# Patient Record
Sex: Female | Born: 1950 | Race: Black or African American | Hispanic: No | Smoking: Former smoker
Health system: Southern US, Community
[De-identification: ages and names within clinical notes are randomized; demographics above are authoritative.]

## PROBLEM LIST (undated history)

## (undated) DIAGNOSIS — E7800 Pure hypercholesterolemia, unspecified: Secondary | ICD-10-CM

---

## 1994-05-13 HISTORY — PX: BREAST EXCISIONAL BIOPSY: SUR124

## 1998-04-21 ENCOUNTER — Other Ambulatory Visit: Admission: RE | Admit: 1998-04-21 | Discharge: 1998-04-21 | Payer: Self-pay | Admitting: General Surgery

## 2003-07-26 ENCOUNTER — Other Ambulatory Visit: Admission: RE | Admit: 2003-07-26 | Discharge: 2003-07-26 | Payer: Self-pay | Admitting: Obstetrics and Gynecology

## 2004-07-11 ENCOUNTER — Encounter (INDEPENDENT_AMBULATORY_CARE_PROVIDER_SITE_OTHER): Payer: Self-pay | Admitting: *Deleted

## 2004-07-11 LAB — CONVERTED CEMR LAB

## 2004-09-26 ENCOUNTER — Ambulatory Visit: Payer: Self-pay | Admitting: Cardiology

## 2004-09-28 ENCOUNTER — Ambulatory Visit: Payer: Self-pay

## 2004-11-01 ENCOUNTER — Ambulatory Visit: Payer: Self-pay | Admitting: Family Medicine

## 2004-11-06 ENCOUNTER — Ambulatory Visit: Payer: Self-pay | Admitting: Sports Medicine

## 2004-11-22 ENCOUNTER — Ambulatory Visit: Payer: Self-pay | Admitting: Cardiology

## 2006-02-17 ENCOUNTER — Encounter: Admission: RE | Admit: 2006-02-17 | Discharge: 2006-02-17 | Payer: Self-pay | Admitting: Internal Medicine

## 2006-07-11 ENCOUNTER — Encounter (INDEPENDENT_AMBULATORY_CARE_PROVIDER_SITE_OTHER): Payer: Self-pay | Admitting: *Deleted

## 2008-11-08 ENCOUNTER — Emergency Department (HOSPITAL_COMMUNITY): Admission: EM | Admit: 2008-11-08 | Discharge: 2008-11-08 | Payer: Self-pay | Admitting: Family Medicine

## 2010-04-17 ENCOUNTER — Encounter: Admission: RE | Admit: 2010-04-17 | Discharge: 2010-04-17 | Payer: Self-pay | Admitting: Internal Medicine

## 2010-06-03 ENCOUNTER — Encounter: Payer: Self-pay | Admitting: Obstetrics and Gynecology

## 2010-08-20 LAB — GC/CHLAMYDIA PROBE AMP, GENITAL
Chlamydia, DNA Probe: NEGATIVE
GC Probe Amp, Genital: NEGATIVE

## 2010-08-20 LAB — WET PREP, GENITAL: Yeast Wet Prep HPF POC: NONE SEEN

## 2013-08-25 ENCOUNTER — Other Ambulatory Visit: Payer: Self-pay | Admitting: Internal Medicine

## 2013-08-25 DIAGNOSIS — Z1231 Encounter for screening mammogram for malignant neoplasm of breast: Secondary | ICD-10-CM

## 2013-08-26 ENCOUNTER — Other Ambulatory Visit: Payer: Self-pay | Admitting: Internal Medicine

## 2013-08-26 ENCOUNTER — Ambulatory Visit
Admission: RE | Admit: 2013-08-26 | Discharge: 2013-08-26 | Disposition: A | Discharge status: Home / Self Care | Payer: BC Managed Care – PPO | Source: Ambulatory Visit | Attending: Internal Medicine | Admitting: Internal Medicine

## 2013-08-26 DIAGNOSIS — M545 Low back pain, unspecified: Secondary | ICD-10-CM

## 2013-09-08 ENCOUNTER — Ambulatory Visit
Admission: RE | Admit: 2013-09-08 | Discharge: 2013-09-08 | Disposition: A | Discharge status: Home / Self Care | Payer: BC Managed Care – PPO | Source: Ambulatory Visit | Attending: Internal Medicine | Admitting: Internal Medicine

## 2013-09-08 ENCOUNTER — Encounter (INDEPENDENT_AMBULATORY_CARE_PROVIDER_SITE_OTHER): Payer: Self-pay

## 2013-09-08 DIAGNOSIS — Z1231 Encounter for screening mammogram for malignant neoplasm of breast: Secondary | ICD-10-CM

## 2015-12-18 ENCOUNTER — Ambulatory Visit
Admission: RE | Admit: 2015-12-18 | Discharge: 2015-12-18 | Disposition: A | Discharge status: Home / Self Care | Payer: Managed Care, Other (non HMO) | Source: Ambulatory Visit | Attending: Internal Medicine | Admitting: Internal Medicine

## 2015-12-18 ENCOUNTER — Other Ambulatory Visit: Payer: Self-pay | Admitting: Internal Medicine

## 2015-12-18 DIAGNOSIS — R42 Dizziness and giddiness: Secondary | ICD-10-CM

## 2015-12-21 ENCOUNTER — Other Ambulatory Visit: Payer: Self-pay | Admitting: Internal Medicine

## 2015-12-21 ENCOUNTER — Ambulatory Visit
Admission: RE | Admit: 2015-12-21 | Discharge: 2015-12-21 | Disposition: A | Discharge status: Home / Self Care | Payer: Managed Care, Other (non HMO) | Source: Ambulatory Visit | Attending: Internal Medicine | Admitting: Internal Medicine

## 2015-12-21 DIAGNOSIS — R930 Abnormal findings on diagnostic imaging of skull and head, not elsewhere classified: Secondary | ICD-10-CM

## 2015-12-21 MED ORDER — GADOBENATE DIMEGLUMINE 529 MG/ML IV SOLN
12.0000 mL | Freq: Once | INTRAVENOUS | Status: AC | PRN
  Administered 2015-12-21: 12 mL via INTRAVENOUS

## 2016-03-11 DIAGNOSIS — Z23 Encounter for immunization: Secondary | ICD-10-CM | POA: Diagnosis not present

## 2016-03-11 DIAGNOSIS — S46911A Strain of unspecified muscle, fascia and tendon at shoulder and upper arm level, right arm, initial encounter: Secondary | ICD-10-CM | POA: Diagnosis not present

## 2016-04-10 DIAGNOSIS — Z1159 Encounter for screening for other viral diseases: Secondary | ICD-10-CM | POA: Diagnosis not present

## 2016-04-10 DIAGNOSIS — Z23 Encounter for immunization: Secondary | ICD-10-CM | POA: Diagnosis not present

## 2016-04-10 DIAGNOSIS — M8588 Other specified disorders of bone density and structure, other site: Secondary | ICD-10-CM | POA: Diagnosis not present

## 2016-04-10 DIAGNOSIS — E782 Mixed hyperlipidemia: Secondary | ICD-10-CM | POA: Diagnosis not present

## 2016-04-10 DIAGNOSIS — Z1389 Encounter for screening for other disorder: Secondary | ICD-10-CM | POA: Diagnosis not present

## 2016-04-10 DIAGNOSIS — M6740 Ganglion, unspecified site: Secondary | ICD-10-CM | POA: Diagnosis not present

## 2016-04-10 DIAGNOSIS — Z0000 Encounter for general adult medical examination without abnormal findings: Secondary | ICD-10-CM | POA: Diagnosis not present

## 2016-04-10 DIAGNOSIS — M1990 Unspecified osteoarthritis, unspecified site: Secondary | ICD-10-CM | POA: Diagnosis not present

## 2016-07-26 DIAGNOSIS — M65341 Trigger finger, right ring finger: Secondary | ICD-10-CM | POA: Diagnosis not present

## 2017-04-09 ENCOUNTER — Ambulatory Visit
Admission: RE | Admit: 2017-04-09 | Discharge: 2017-04-09 | Disposition: A | Discharge status: Home / Self Care | Payer: Medicare Other | Source: Ambulatory Visit | Attending: Internal Medicine | Admitting: Internal Medicine

## 2017-04-09 ENCOUNTER — Other Ambulatory Visit: Payer: Self-pay | Admitting: Internal Medicine

## 2017-04-09 DIAGNOSIS — Z1231 Encounter for screening mammogram for malignant neoplasm of breast: Secondary | ICD-10-CM | POA: Diagnosis not present

## 2017-04-09 DIAGNOSIS — Z0000 Encounter for general adult medical examination without abnormal findings: Secondary | ICD-10-CM | POA: Diagnosis not present

## 2017-04-09 DIAGNOSIS — E782 Mixed hyperlipidemia: Secondary | ICD-10-CM | POA: Diagnosis not present

## 2017-04-09 DIAGNOSIS — M8588 Other specified disorders of bone density and structure, other site: Secondary | ICD-10-CM | POA: Diagnosis not present

## 2017-04-09 DIAGNOSIS — M1990 Unspecified osteoarthritis, unspecified site: Secondary | ICD-10-CM | POA: Diagnosis not present

## 2017-09-29 ENCOUNTER — Other Ambulatory Visit: Payer: Self-pay | Admitting: Internal Medicine

## 2017-09-29 ENCOUNTER — Ambulatory Visit
Admission: RE | Admit: 2017-09-29 | Discharge: 2017-09-29 | Disposition: A | Discharge status: Home / Self Care | Payer: Medicare Other | Source: Ambulatory Visit | Attending: Internal Medicine | Admitting: Internal Medicine

## 2017-09-29 DIAGNOSIS — M25572 Pain in left ankle and joints of left foot: Secondary | ICD-10-CM

## 2017-09-29 DIAGNOSIS — S99922A Unspecified injury of left foot, initial encounter: Secondary | ICD-10-CM | POA: Diagnosis not present

## 2017-09-29 DIAGNOSIS — S99912A Unspecified injury of left ankle, initial encounter: Secondary | ICD-10-CM

## 2017-09-29 DIAGNOSIS — M79672 Pain in left foot: Secondary | ICD-10-CM | POA: Diagnosis not present

## 2017-09-29 DIAGNOSIS — S93492A Sprain of other ligament of left ankle, initial encounter: Secondary | ICD-10-CM | POA: Diagnosis not present

## 2017-09-29 DIAGNOSIS — S92102A Unspecified fracture of left talus, initial encounter for closed fracture: Secondary | ICD-10-CM | POA: Diagnosis not present

## 2017-09-29 DIAGNOSIS — M7989 Other specified soft tissue disorders: Secondary | ICD-10-CM | POA: Diagnosis not present

## 2017-10-15 DIAGNOSIS — S93492D Sprain of other ligament of left ankle, subsequent encounter: Secondary | ICD-10-CM | POA: Diagnosis not present

## 2017-11-06 DIAGNOSIS — S93492D Sprain of other ligament of left ankle, subsequent encounter: Secondary | ICD-10-CM | POA: Diagnosis not present

## 2018-11-17 DIAGNOSIS — H25813 Combined forms of age-related cataract, bilateral: Secondary | ICD-10-CM | POA: Diagnosis not present

## 2018-11-17 DIAGNOSIS — H40033 Anatomical narrow angle, bilateral: Secondary | ICD-10-CM | POA: Diagnosis not present

## 2018-12-09 ENCOUNTER — Emergency Department (HOSPITAL_BASED_OUTPATIENT_CLINIC_OR_DEPARTMENT_OTHER)
Admission: EM | Admit: 2018-12-09 | Discharge: 2018-12-09 | Disposition: A | Discharge status: Home / Self Care | Payer: Medicare Other | Attending: Emergency Medicine | Admitting: Emergency Medicine

## 2018-12-09 ENCOUNTER — Emergency Department (HOSPITAL_BASED_OUTPATIENT_CLINIC_OR_DEPARTMENT_OTHER): Payer: Medicare Other

## 2018-12-09 ENCOUNTER — Other Ambulatory Visit: Payer: Self-pay

## 2018-12-09 ENCOUNTER — Encounter (HOSPITAL_BASED_OUTPATIENT_CLINIC_OR_DEPARTMENT_OTHER): Payer: Self-pay | Admitting: Emergency Medicine

## 2018-12-09 DIAGNOSIS — Z87891 Personal history of nicotine dependence: Secondary | ICD-10-CM | POA: Insufficient documentation

## 2018-12-09 DIAGNOSIS — S92501A Displaced unspecified fracture of right lesser toe(s), initial encounter for closed fracture: Secondary | ICD-10-CM

## 2018-12-09 DIAGNOSIS — Z7982 Long term (current) use of aspirin: Secondary | ICD-10-CM | POA: Diagnosis not present

## 2018-12-09 DIAGNOSIS — S92511A Displaced fracture of proximal phalanx of right lesser toe(s), initial encounter for closed fracture: Secondary | ICD-10-CM | POA: Insufficient documentation

## 2018-12-09 DIAGNOSIS — Y939 Activity, unspecified: Secondary | ICD-10-CM | POA: Insufficient documentation

## 2018-12-09 DIAGNOSIS — Y999 Unspecified external cause status: Secondary | ICD-10-CM | POA: Diagnosis not present

## 2018-12-09 DIAGNOSIS — Y92009 Unspecified place in unspecified non-institutional (private) residence as the place of occurrence of the external cause: Secondary | ICD-10-CM | POA: Insufficient documentation

## 2018-12-09 DIAGNOSIS — S99921A Unspecified injury of right foot, initial encounter: Secondary | ICD-10-CM | POA: Diagnosis not present

## 2018-12-09 DIAGNOSIS — W2203XA Walked into furniture, initial encounter: Secondary | ICD-10-CM | POA: Diagnosis not present

## 2018-12-09 HISTORY — DX: Pure hypercholesterolemia, unspecified: E78.00

## 2018-12-09 NOTE — ED Triage Notes (Signed)
Stubbed right little toe on dresser this morning.  Pain and redness.  No deformity. Skin is intact.

## 2018-12-09 NOTE — ED Provider Notes (Signed)
[facility] EMERGENCY DEPARTMENT Provider Note   CSN: [id number] Arrival date & time: 12/09/18  0940     History   Chief Complaint Chief Complaint  Patient presents with  . Toe Injury    HPI [place] Gerbino is a 68 y.o. female.     Patient is a 68 year old female presenting with a right fifth toe injury.  Patient states her alarm clock went off this morning.  She stood up to turn it off and stubbed her toe on the dresser.  Pain is worse with ambulation and weightbearing.  She has tried icing it with little relief.  The history is provided by the patient.    Past Medical History:  Diagnosis Date  . High cholesterol     There are no active problems to display for this patient.   Past Surgical History:  Procedure Laterality Date  . BREAST EXCISIONAL BIOPSY Left 1996   benign     OB History   No obstetric history on file.      Home Medications    Prior to Admission medications   Medication Sig Start Date End Date Taking? Authorizing Provider  aspirin 81 MG tablet Take 81 mg by mouth daily.    [provider]  cholecalciferol (VITAMIN D) 1000 UNITS tablet Take 1,000 Units by mouth daily.    [provider]  Multiple Vitamin (MULTI VITAMIN DAILY) TABS Take by mouth.    [provider]  Omega-3 Fatty Acids (FISH OIL) 1200 MG CAPS Take by mouth.    [provider]  simvastatin (ZOCOR) 20 MG tablet Take 20 mg by mouth daily.    [provider]    Family History No family history on file.  Social History Social History   Tobacco Use  . Smoking status: Former Games developermoker  . Smokeless tobacco: Never Used  Substance Use Topics  . Alcohol use: Yes    Comment: occ  . Drug use: Never     Allergies   Statins and Sulfa antibiotics   Review of Systems Review of Systems  All other systems reviewed and are negative.    Physical Exam Updated Vital Signs BP (!) 142/88 (BP Location: Right Arm)   Pulse 63    Temp 98.1 F (36.7 C) (Oral)   Resp 16   Ht 5' 1.5" (1.562 m)   Wt 55.2 kg   SpO2 98%   BMI 22.62 kg/m   Physical Exam Vitals signs and nursing note reviewed.  Constitutional:      General: She is not in acute distress.    Appearance: Normal appearance. She is not ill-appearing.  HENT:     Head: Normocephalic.  Pulmonary:     Effort: Pulmonary effort is normal.  Musculoskeletal:     Comments: The right fifth toe is mildly swollen and erythematous.  There is no obvious deformity or rotation.  Neurological:     Mental Status: She is alert and oriented to person, place, and time.      ED Treatments / Results  Labs (all labs ordered are listed, but only abnormal results are displayed) Labs Reviewed - No data to display  EKG None  Radiology No results found.  Procedures Procedures (including critical care time)  Medications Ordered in ED Medications - No data to display   Initial Impression / Assessment and Plan / ED Course  I have reviewed the triage vital signs and the nursing notes.  Pertinent labs & imaging results that were available during my  care of the patient were reviewed by me and considered in my medical decision making (see chart for details).  X-rays show what suggests a minimally displaced fracture to the base of the fifth proximal phalanx.  This will be treated with buddy taping and follow-up as needed.  Patient concerned about being able to work and was provided an excuse for the next 3 days.  Final Clinical Impressions(s) / ED Diagnoses   Final diagnoses:  None    ED Discharge Orders    None       Veryl Speak, MD 12/09/18 1112

## 2018-12-09 NOTE — Discharge Instructions (Signed)
Buddy tape for stability and comfort.  Keep your foot elevated for the next 2 days as much as possible.  Ice for 20 minutes every 2 hours while awake for the next 2 days.  Ibuprofen 600 mg every 6 hours as needed for pain.

## 2019-03-01 DIAGNOSIS — H6123 Impacted cerumen, bilateral: Secondary | ICD-10-CM | POA: Diagnosis not present

## 2019-06-15 DIAGNOSIS — S92344A Nondisplaced fracture of fourth metatarsal bone, right foot, initial encounter for closed fracture: Secondary | ICD-10-CM | POA: Diagnosis not present

## 2019-06-15 DIAGNOSIS — S93491A Sprain of other ligament of right ankle, initial encounter: Secondary | ICD-10-CM | POA: Diagnosis not present

## 2019-06-18 IMAGING — DX DG FOOT COMPLETE 3+V*L*
3 series · 3 of 3 positions shown · non-contrast
Comparison: None.

CLINICAL DATA: Fall, foot/ankle pain/injury

EXAM:
LEFT FOOT - COMPLETE 3+ VIEW

[dg foot complete left (1 of 3)]
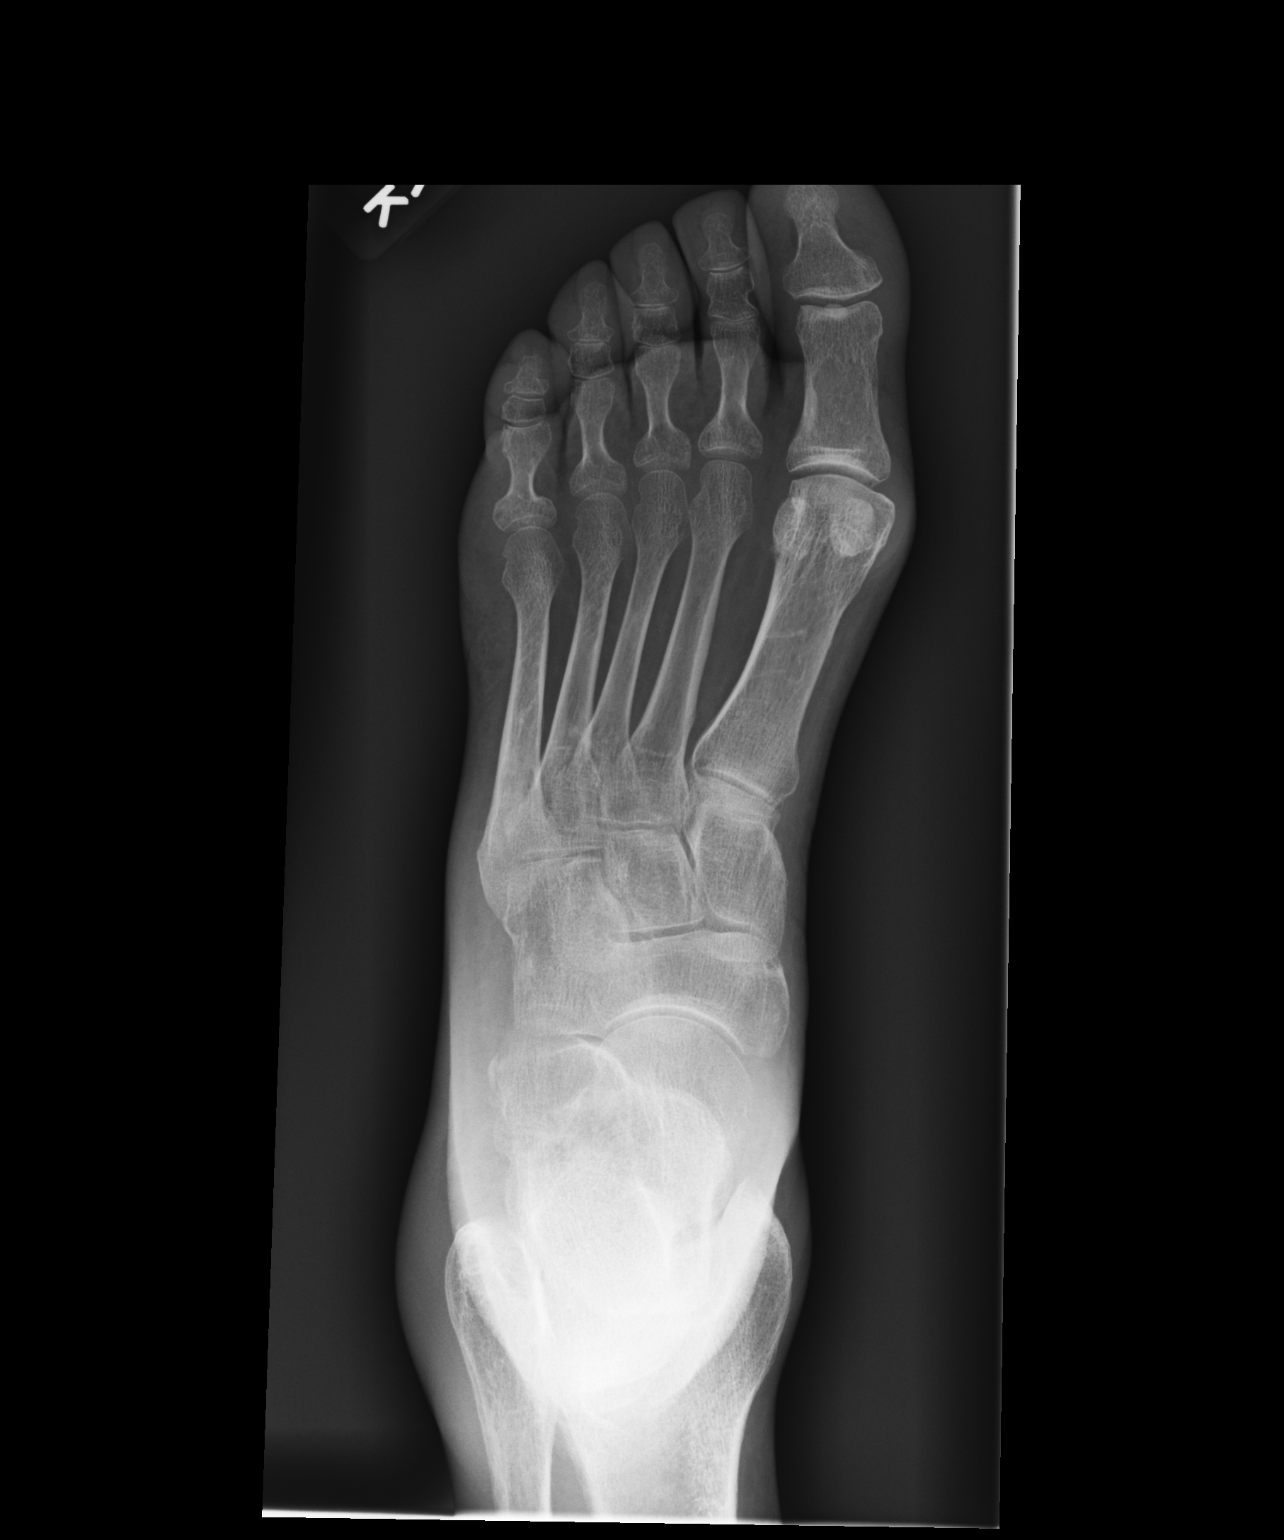

[dg foot complete left (2 of 3)]
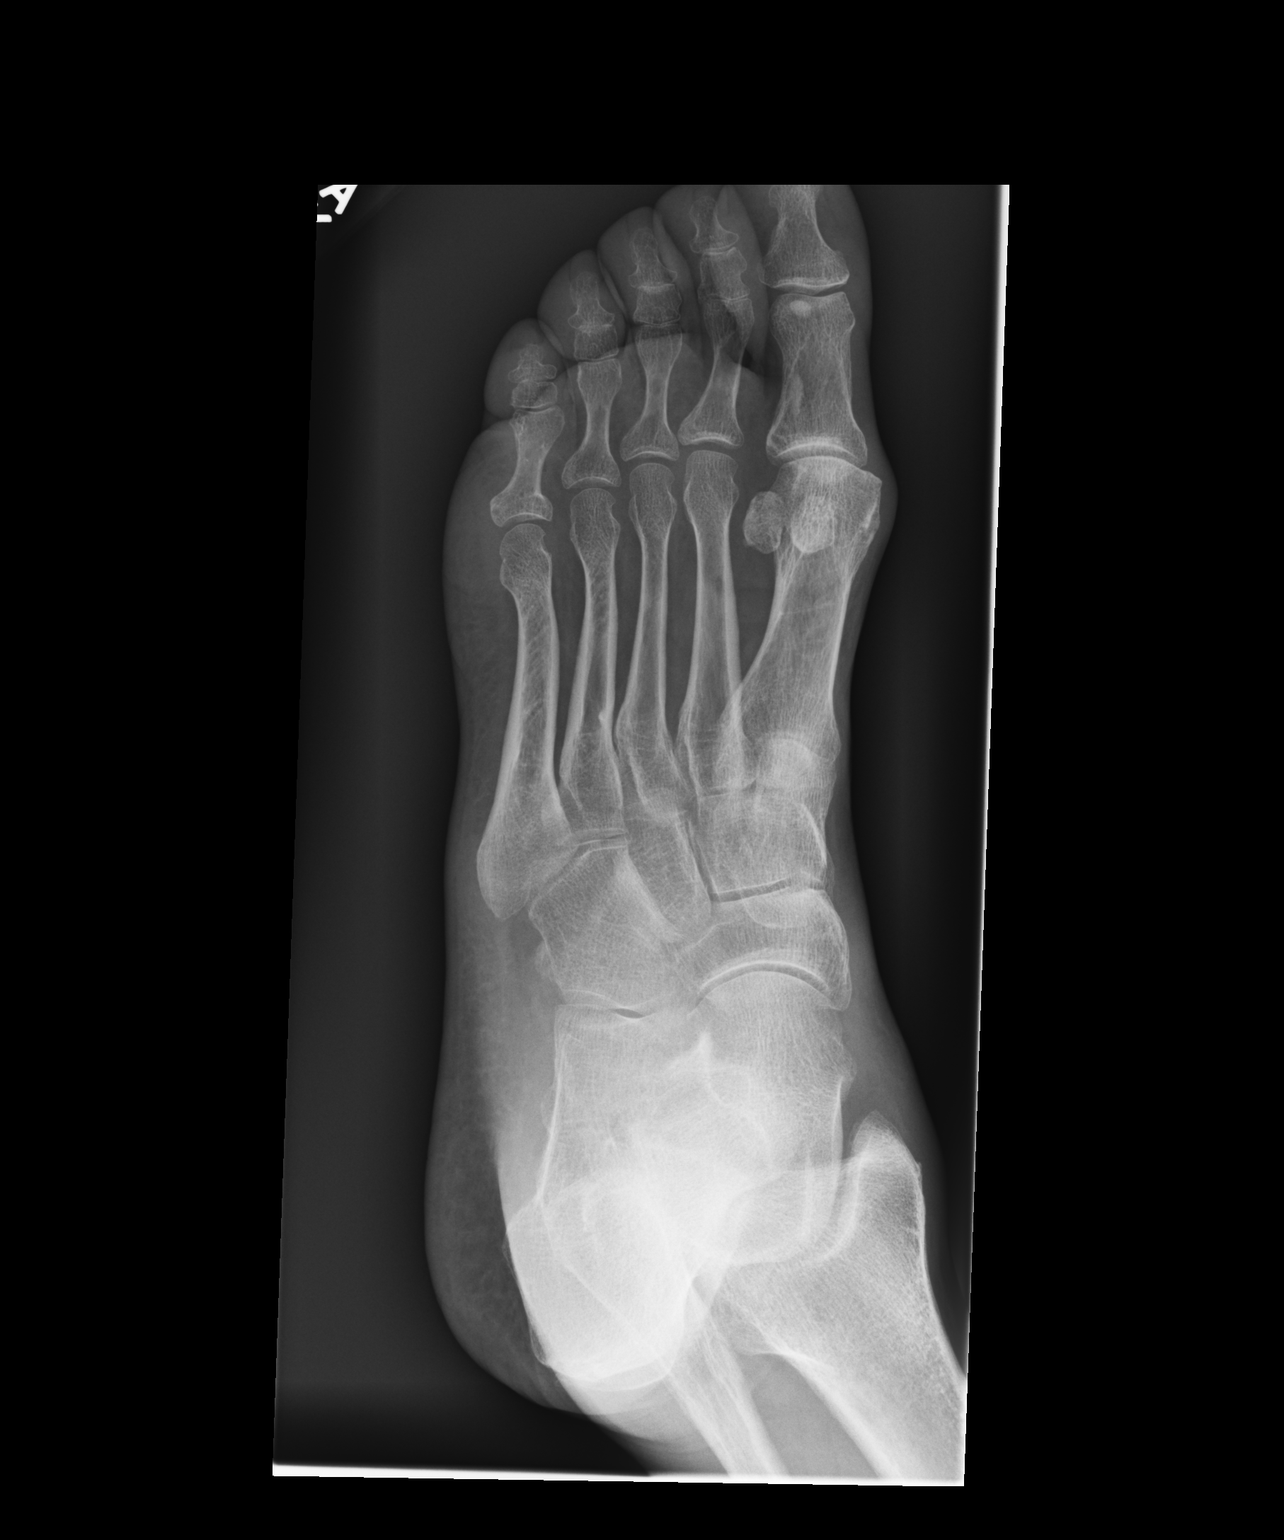

[dg foot complete left (3 of 3)]
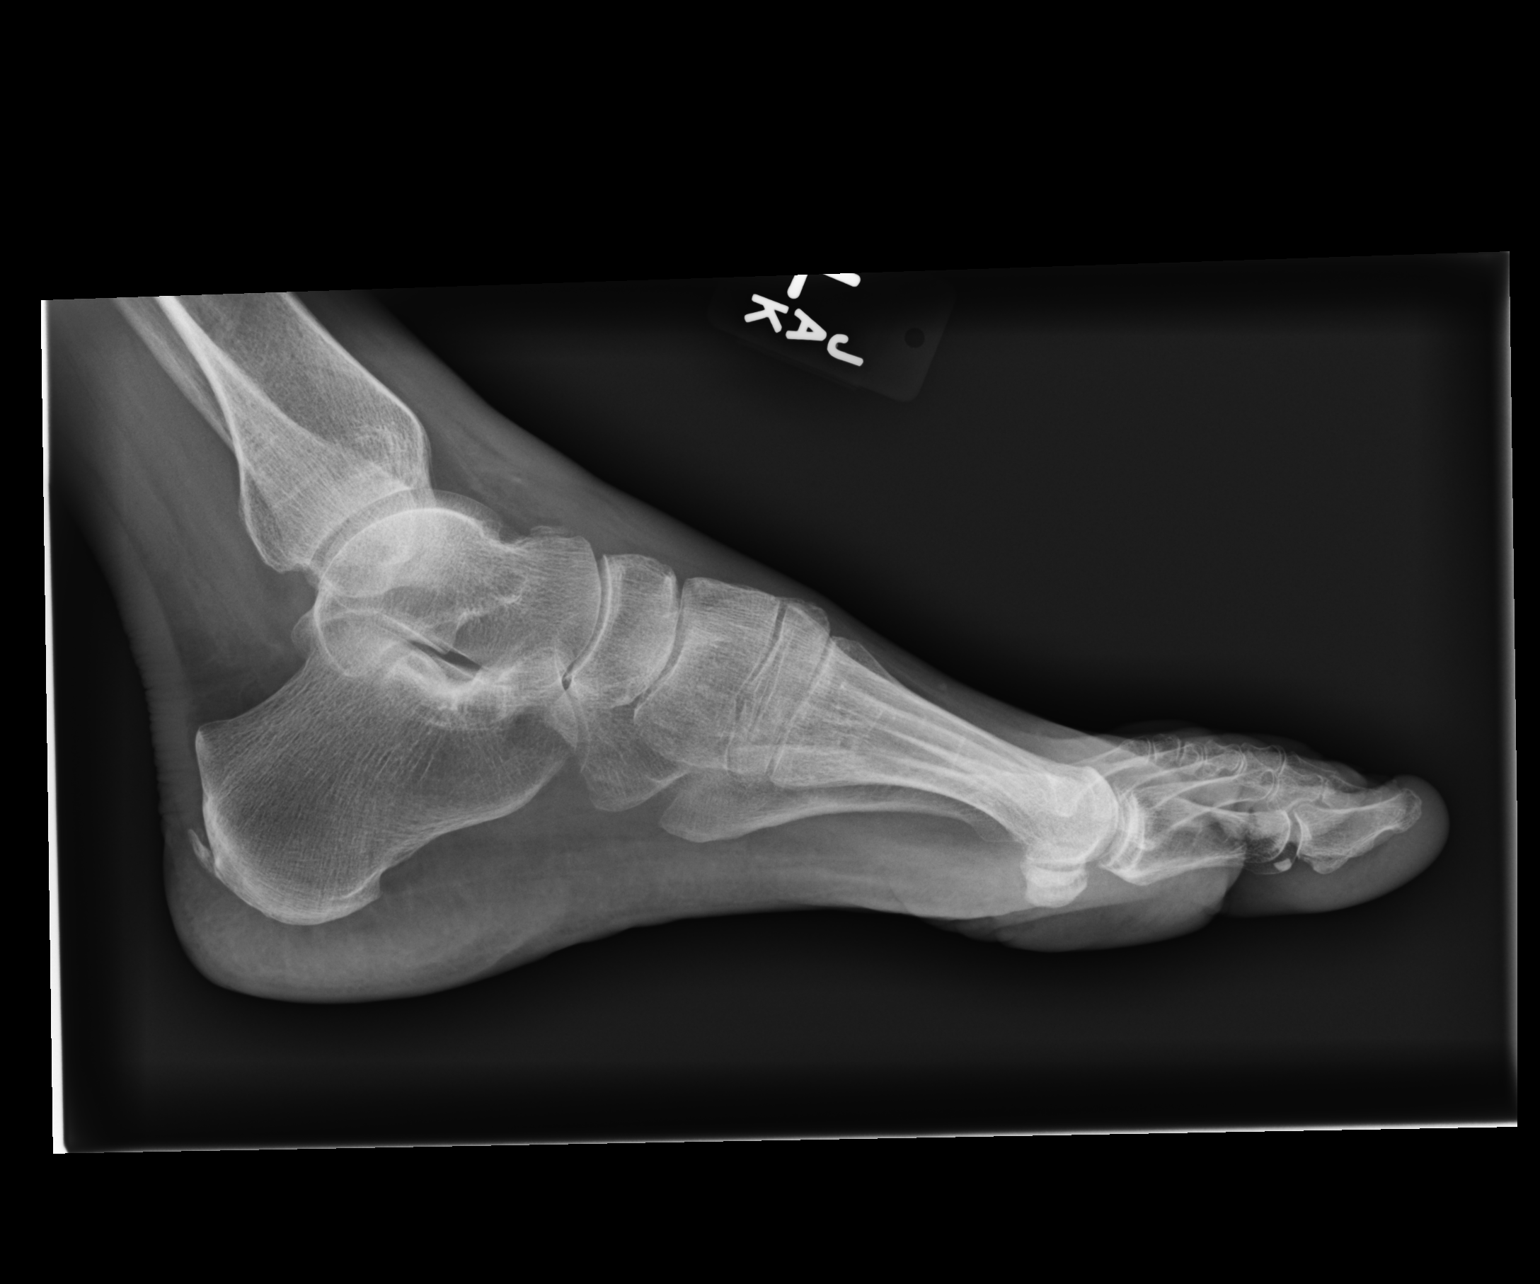

[3 of 3 positions shown; findings below may reference images not displayed]

FINDINGS: No fracture or dislocation is seen.

The joint spaces are preserved.

Mild dorsal soft tissue swelling.
IMPRESSION: Negative.

## 2019-06-29 DIAGNOSIS — S92344D Nondisplaced fracture of fourth metatarsal bone, right foot, subsequent encounter for fracture with routine healing: Secondary | ICD-10-CM | POA: Diagnosis not present

## 2019-06-29 DIAGNOSIS — R5383 Other fatigue: Secondary | ICD-10-CM | POA: Diagnosis not present

## 2019-06-29 DIAGNOSIS — E559 Vitamin D deficiency, unspecified: Secondary | ICD-10-CM | POA: Diagnosis not present

## 2019-06-29 DIAGNOSIS — M810 Age-related osteoporosis without current pathological fracture: Secondary | ICD-10-CM | POA: Diagnosis not present

## 2019-07-12 ENCOUNTER — Ambulatory Visit: Payer: Medicare Other | Attending: Internal Medicine

## 2019-07-12 DIAGNOSIS — Z23 Encounter for immunization: Secondary | ICD-10-CM | POA: Insufficient documentation

## 2019-07-12 NOTE — Progress Notes (Signed)
   Covid-19 Vaccination Clinic  Name:  Carol Hancock    MRN: [id number] DOB: 07/01/1950  07/12/2019  [name] was observed post Covid-19 immunization for 15 minutes without incidence. She was provided with Vaccine Information Sheet and instruction to access the V-Safe system.   [name] was instructed to call 911 with any severe reactions post vaccine: Marland Kitchen Difficulty breathing  . Swelling of your face and throat  . A fast heartbeat  . A bad rash all over your body  . Dizziness and weakness    Immunizations Administered    Name Date Dose VIS Date Route   Pfizer COVID-19 Vaccine 07/12/2019 10:40 AM 0.3 mL 04/23/2019 Intramuscular   Manufacturer: [employer], [employer]   Lot: [id number]   NDC: [address]-7

## 2019-07-13 DIAGNOSIS — S92344D Nondisplaced fracture of fourth metatarsal bone, right foot, subsequent encounter for fracture with routine healing: Secondary | ICD-10-CM | POA: Diagnosis not present

## 2019-08-10 ENCOUNTER — Ambulatory Visit: Payer: Medicare Other | Attending: Internal Medicine

## 2019-08-10 DIAGNOSIS — Z0000 Encounter for general adult medical examination without abnormal findings: Secondary | ICD-10-CM | POA: Diagnosis not present

## 2019-08-10 DIAGNOSIS — E782 Mixed hyperlipidemia: Secondary | ICD-10-CM | POA: Diagnosis not present

## 2019-08-10 DIAGNOSIS — M1990 Unspecified osteoarthritis, unspecified site: Secondary | ICD-10-CM | POA: Diagnosis not present

## 2019-08-10 DIAGNOSIS — Z23 Encounter for immunization: Secondary | ICD-10-CM

## 2019-08-10 DIAGNOSIS — Z1389 Encounter for screening for other disorder: Secondary | ICD-10-CM | POA: Diagnosis not present

## 2019-08-10 NOTE — Progress Notes (Signed)
   Covid-19 Vaccination Clinic  Name:  Carol Hancock    MRN: [id number] DOB: [birth year]  08/10/2019  Carol Hancock was observed post Covid-19 immunization for 30 minutes  without incident. She was provided with Vaccine Information Sheet and instruction to access the V-Safe system.   Carol Hancock was instructed to call 911 with any severe reactions post vaccine: Marland Kitchen Difficulty breathing  . Swelling of face and throat  . A fast heartbeat  . A bad rash all over body  . Dizziness and weakness   Immunizations Administered    Name Date Dose VIS Date Route   Pfizer COVID-19 Vaccine 08/10/2019 10:09 AM 0.3 mL 04/23/2019 Intramuscular   Manufacturer: [employer], [employer]   Lot: [id number]   NDC: [address]-4

## 2019-08-11 ENCOUNTER — Other Ambulatory Visit: Payer: Self-pay | Admitting: Internal Medicine

## 2019-08-11 DIAGNOSIS — M8580 Other specified disorders of bone density and structure, unspecified site: Secondary | ICD-10-CM

## 2019-08-11 DIAGNOSIS — Z1231 Encounter for screening mammogram for malignant neoplasm of breast: Secondary | ICD-10-CM

## 2019-10-19 ENCOUNTER — Ambulatory Visit
Admission: RE | Admit: 2019-10-19 | Discharge: 2019-10-19 | Disposition: A | Discharge status: Home / Self Care | Payer: Medicare Other | Source: Ambulatory Visit | Attending: Internal Medicine | Admitting: Internal Medicine

## 2019-10-19 ENCOUNTER — Other Ambulatory Visit: Payer: Self-pay

## 2019-10-19 DIAGNOSIS — M8580 Other specified disorders of bone density and structure, unspecified site: Secondary | ICD-10-CM

## 2019-10-19 DIAGNOSIS — Z1231 Encounter for screening mammogram for malignant neoplasm of breast: Secondary | ICD-10-CM

## 2019-10-19 DIAGNOSIS — Z780 Asymptomatic menopausal state: Secondary | ICD-10-CM | POA: Diagnosis not present

## 2019-10-19 DIAGNOSIS — M8589 Other specified disorders of bone density and structure, multiple sites: Secondary | ICD-10-CM | POA: Diagnosis not present

## 2019-11-19 DIAGNOSIS — M1711 Unilateral primary osteoarthritis, right knee: Secondary | ICD-10-CM | POA: Diagnosis not present

## 2019-11-24 DIAGNOSIS — H40033 Anatomical narrow angle, bilateral: Secondary | ICD-10-CM | POA: Diagnosis not present

## 2019-11-24 DIAGNOSIS — H16223 Keratoconjunctivitis sicca, not specified as Sjogren's, bilateral: Secondary | ICD-10-CM | POA: Diagnosis not present

## 2019-11-24 DIAGNOSIS — H25813 Combined forms of age-related cataract, bilateral: Secondary | ICD-10-CM | POA: Diagnosis not present

## 2019-12-09 DIAGNOSIS — M2041 Other hammer toe(s) (acquired), right foot: Secondary | ICD-10-CM | POA: Diagnosis not present

## 2019-12-09 DIAGNOSIS — L84 Corns and callosities: Secondary | ICD-10-CM | POA: Diagnosis not present

## 2019-12-09 DIAGNOSIS — M79671 Pain in right foot: Secondary | ICD-10-CM | POA: Diagnosis not present

## 2020-08-14 DIAGNOSIS — L659 Nonscarring hair loss, unspecified: Secondary | ICD-10-CM | POA: Diagnosis not present

## 2020-08-14 DIAGNOSIS — E782 Mixed hyperlipidemia: Secondary | ICD-10-CM | POA: Diagnosis not present

## 2020-08-14 DIAGNOSIS — R7303 Prediabetes: Secondary | ICD-10-CM | POA: Diagnosis not present

## 2020-08-14 DIAGNOSIS — M8580 Other specified disorders of bone density and structure, unspecified site: Secondary | ICD-10-CM | POA: Diagnosis not present

## 2020-08-28 ENCOUNTER — Other Ambulatory Visit: Payer: Self-pay | Admitting: Internal Medicine

## 2020-08-28 DIAGNOSIS — Z1231 Encounter for screening mammogram for malignant neoplasm of breast: Secondary | ICD-10-CM

## 2020-10-23 ENCOUNTER — Ambulatory Visit: Payer: Medicare Other

## 2020-11-27 DIAGNOSIS — H16223 Keratoconjunctivitis sicca, not specified as Sjogren's, bilateral: Secondary | ICD-10-CM | POA: Diagnosis not present

## 2020-11-27 DIAGNOSIS — H40033 Anatomical narrow angle, bilateral: Secondary | ICD-10-CM | POA: Diagnosis not present

## 2020-11-27 DIAGNOSIS — H25813 Combined forms of age-related cataract, bilateral: Secondary | ICD-10-CM | POA: Diagnosis not present

## 2020-12-15 ENCOUNTER — Ambulatory Visit
Admission: RE | Admit: 2020-12-15 | Discharge: 2020-12-15 | Disposition: A | Discharge status: Home / Self Care | Payer: Medicare Other | Source: Ambulatory Visit | Attending: Internal Medicine | Admitting: Internal Medicine

## 2020-12-15 ENCOUNTER — Other Ambulatory Visit: Payer: Self-pay

## 2020-12-15 DIAGNOSIS — Z1231 Encounter for screening mammogram for malignant neoplasm of breast: Secondary | ICD-10-CM | POA: Diagnosis not present

## 2021-02-16 DIAGNOSIS — H25812 Combined forms of age-related cataract, left eye: Secondary | ICD-10-CM | POA: Diagnosis not present

## 2021-04-18 DIAGNOSIS — Z01419 Encounter for gynecological examination (general) (routine) without abnormal findings: Secondary | ICD-10-CM | POA: Diagnosis not present

## 2021-04-18 DIAGNOSIS — R87615 Unsatisfactory cytologic smear of cervix: Secondary | ICD-10-CM | POA: Diagnosis not present

## 2021-05-17 DIAGNOSIS — L669 Cicatricial alopecia, unspecified: Secondary | ICD-10-CM | POA: Diagnosis not present

## 2021-05-17 DIAGNOSIS — L648 Other androgenic alopecia: Secondary | ICD-10-CM | POA: Diagnosis not present

## 2021-05-17 DIAGNOSIS — L668 Other cicatricial alopecia: Secondary | ICD-10-CM | POA: Diagnosis not present

## 2021-06-01 DIAGNOSIS — H25811 Combined forms of age-related cataract, right eye: Secondary | ICD-10-CM | POA: Diagnosis not present

## 2021-06-06 DIAGNOSIS — L668 Other cicatricial alopecia: Secondary | ICD-10-CM | POA: Diagnosis not present

## 2021-07-02 DIAGNOSIS — R87615 Unsatisfactory cytologic smear of cervix: Secondary | ICD-10-CM | POA: Diagnosis not present

## 2021-07-07 IMAGING — MG DIGITAL SCREENING BILAT W/ TOMO W/ CAD
8 series · 9 of 24 positions shown · non-contrast
Comparison: Previous exam(s).

CLINICAL DATA: Screening.

EXAM:
DIGITAL SCREENING BILATERAL MAMMOGRAM WITH TOMO AND CAD

[R MLO synth-2D]
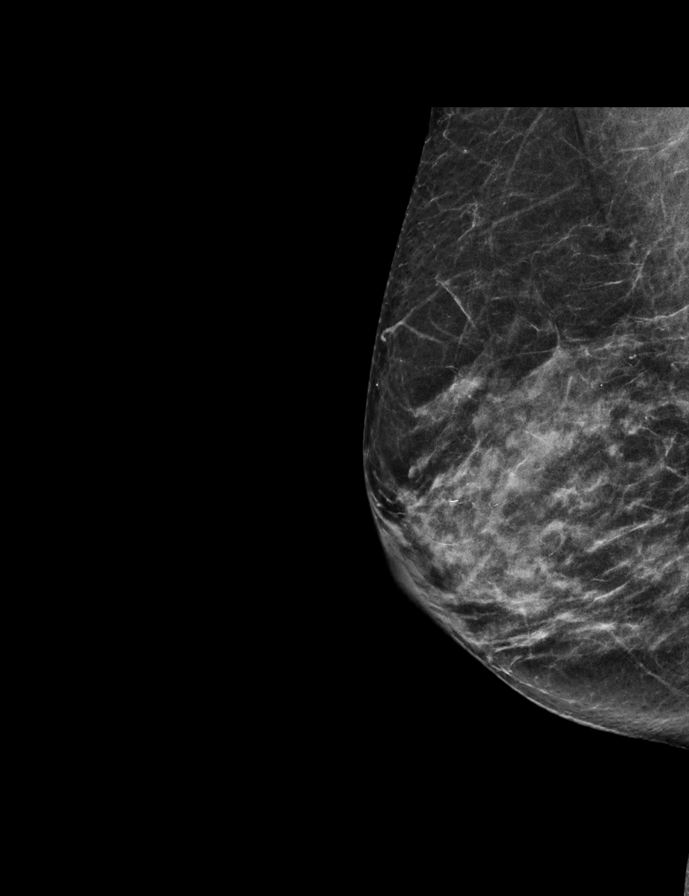

[L CC synth-2D]
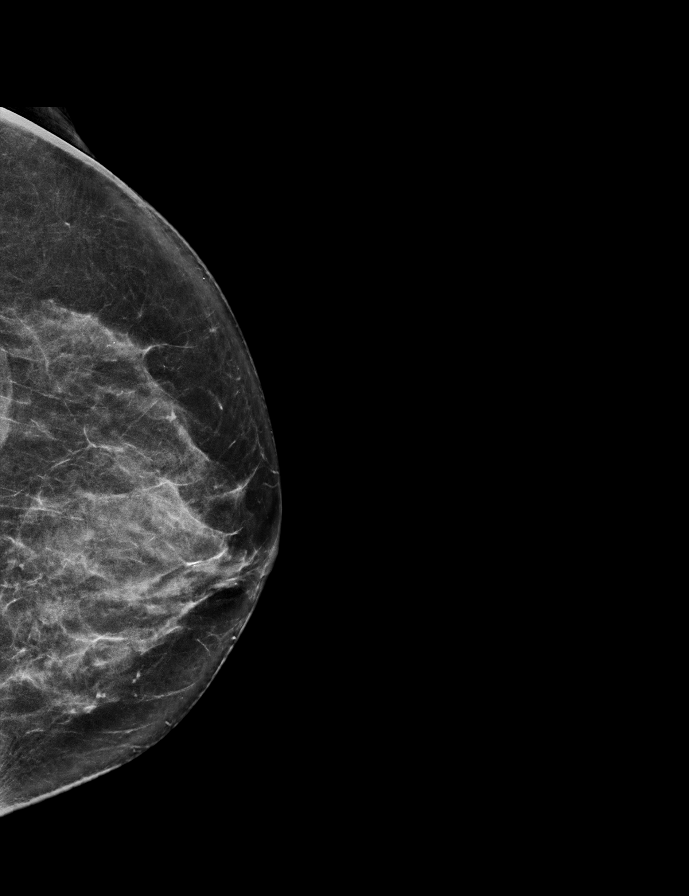

[R CC synth-2D]
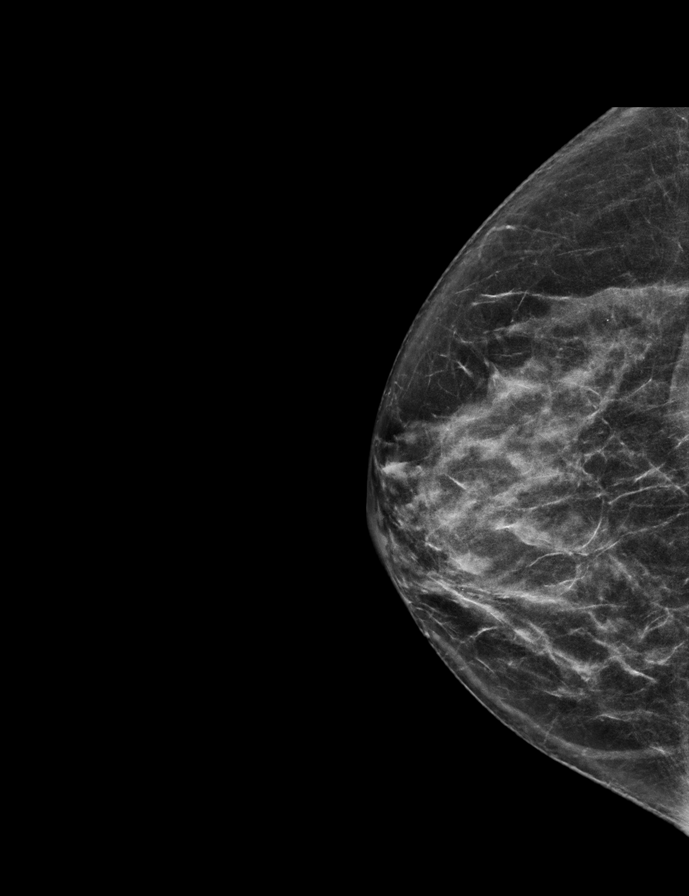

[L MLO synth-2D]
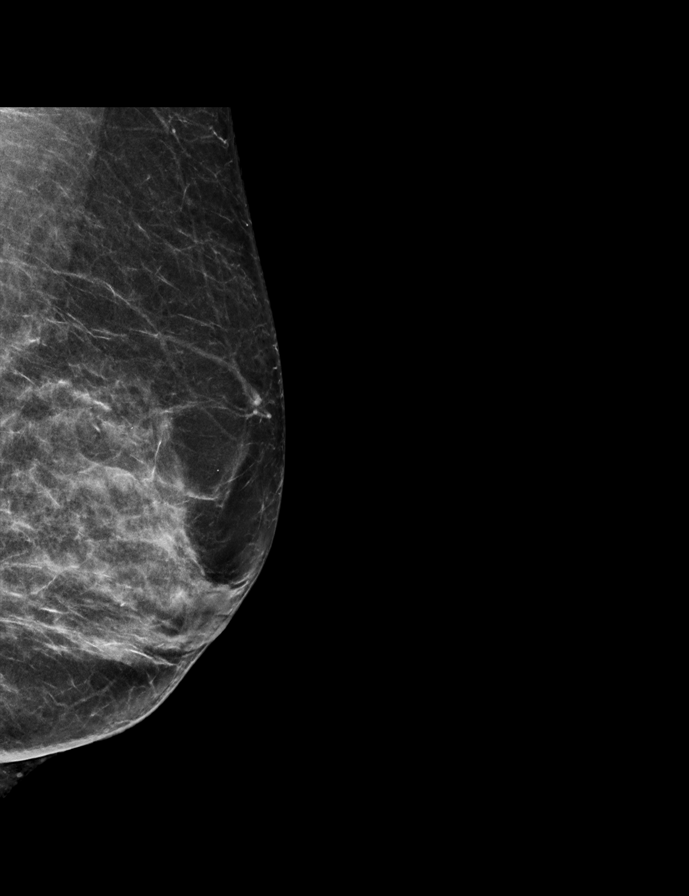

[L MLO tomo · 2 of 61 frames shown]
[frame 20/61]
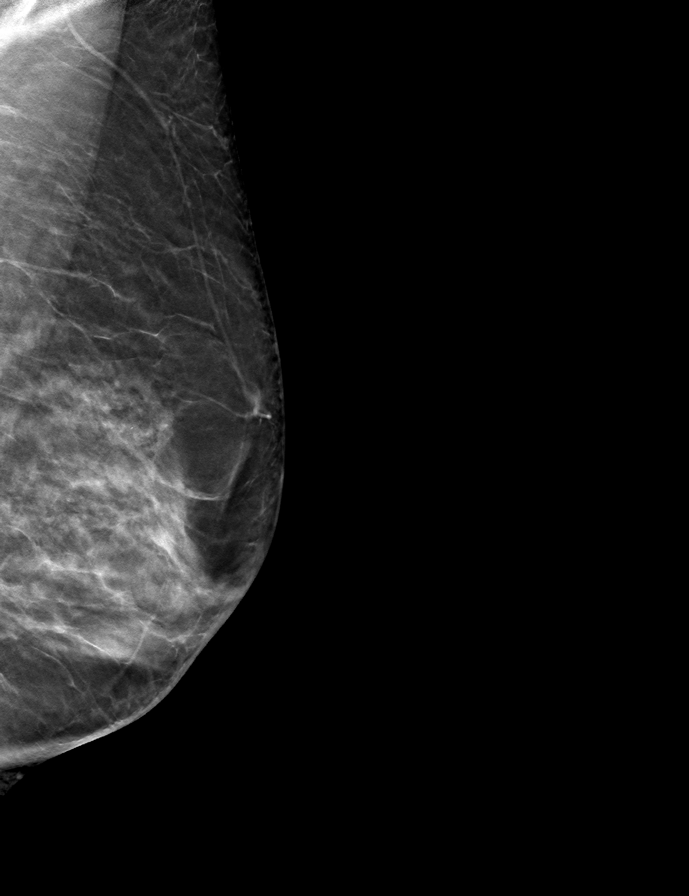
[frame 31/61]
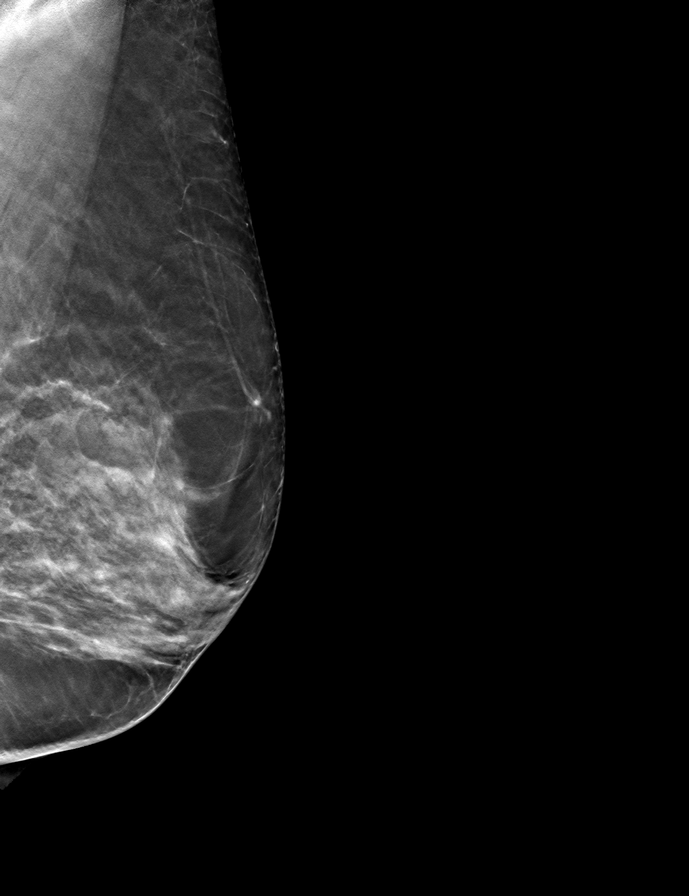

[R MLO tomo · tomo slice 28/55.0]
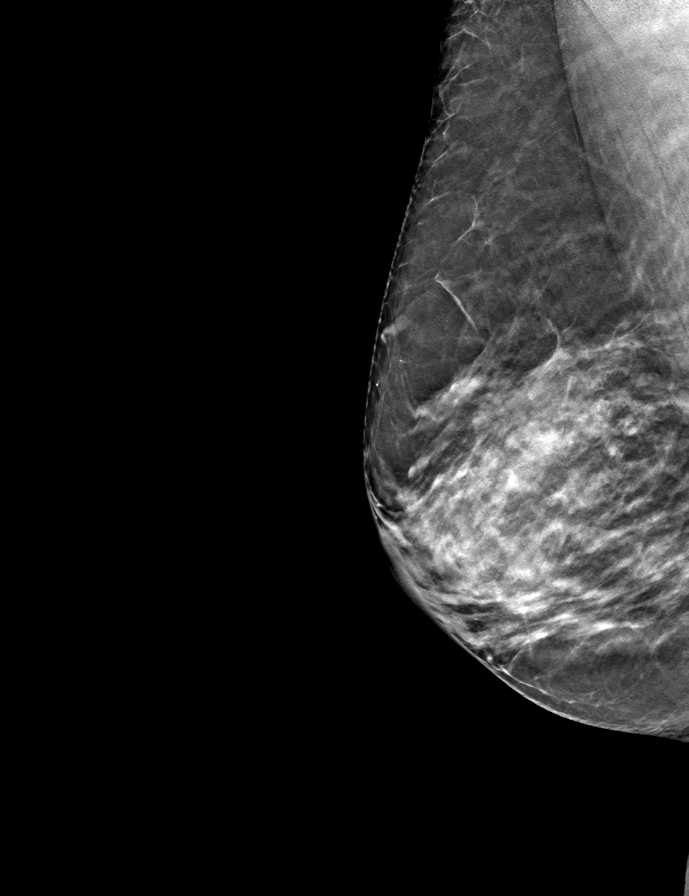

[R CC tomo · tomo slice 31/62.0]
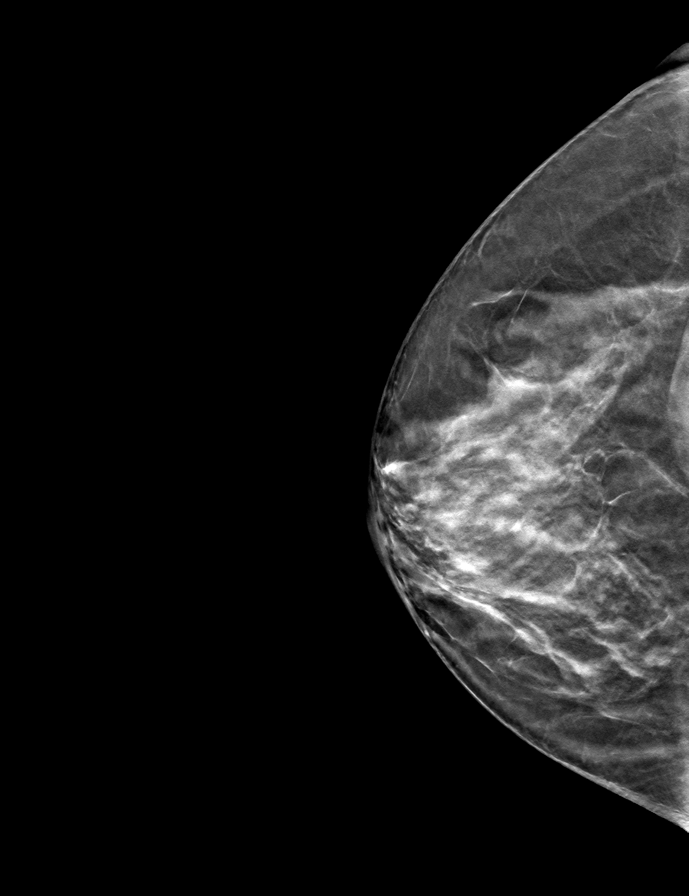

[L CC tomo · tomo slice 33/64.0]
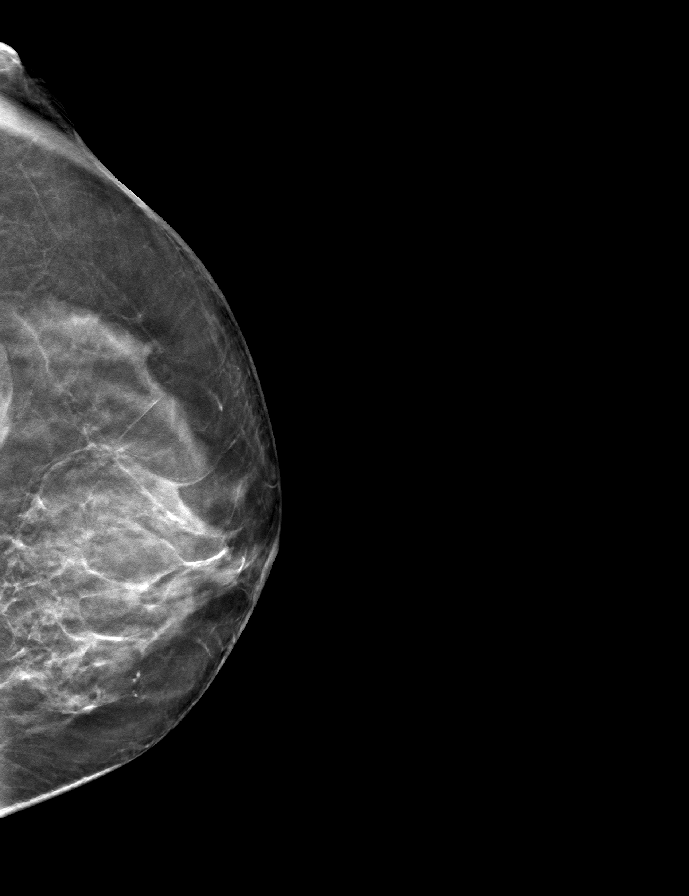

[9 of 24 positions shown; findings below may reference images not displayed]

ACR Breast Density Category c: The breast tissue is heterogeneously
dense, which may obscure small masses.
FINDINGS: There are no findings suspicious for malignancy. Images were
processed with CAD.
IMPRESSION: No mammographic evidence of malignancy. A result letter of this
screening mammogram will be mailed directly to the patient.

RECOMMENDATION:
Screening mammogram in one year. (Code:[id number])

BI-RADS CATEGORY  1: Negative.

## 2021-08-15 DIAGNOSIS — R7303 Prediabetes: Secondary | ICD-10-CM | POA: Diagnosis not present

## 2021-08-15 DIAGNOSIS — M1990 Unspecified osteoarthritis, unspecified site: Secondary | ICD-10-CM | POA: Diagnosis not present

## 2021-08-15 DIAGNOSIS — M8588 Other specified disorders of bone density and structure, other site: Secondary | ICD-10-CM | POA: Diagnosis not present

## 2021-08-15 DIAGNOSIS — E782 Mixed hyperlipidemia: Secondary | ICD-10-CM | POA: Diagnosis not present

## 2021-12-25 ENCOUNTER — Other Ambulatory Visit: Payer: Self-pay | Admitting: Internal Medicine

## 2021-12-25 DIAGNOSIS — R410 Disorientation, unspecified: Secondary | ICD-10-CM | POA: Diagnosis not present

## 2021-12-25 DIAGNOSIS — G454 Transient global amnesia: Secondary | ICD-10-CM | POA: Diagnosis not present

## 2021-12-26 ENCOUNTER — Ambulatory Visit
Admission: RE | Admit: 2021-12-26 | Discharge: 2021-12-26 | Disposition: A | Discharge status: Home / Self Care | Payer: Medicare Other | Source: Ambulatory Visit | Attending: Internal Medicine | Admitting: Internal Medicine

## 2021-12-26 DIAGNOSIS — R410 Disorientation, unspecified: Secondary | ICD-10-CM

## 2022-02-03 DIAGNOSIS — Z6823 Body mass index (BMI) 23.0-23.9, adult: Secondary | ICD-10-CM | POA: Diagnosis not present

## 2022-02-03 DIAGNOSIS — H1132 Conjunctival hemorrhage, left eye: Secondary | ICD-10-CM | POA: Diagnosis not present

## 2022-03-27 DIAGNOSIS — Z961 Presence of intraocular lens: Secondary | ICD-10-CM | POA: Diagnosis not present

## 2022-03-27 DIAGNOSIS — H40033 Anatomical narrow angle, bilateral: Secondary | ICD-10-CM | POA: Diagnosis not present

## 2022-04-12 DIAGNOSIS — H5213 Myopia, bilateral: Secondary | ICD-10-CM | POA: Diagnosis not present

## 2022-04-30 DIAGNOSIS — M7551 Bursitis of right shoulder: Secondary | ICD-10-CM | POA: Diagnosis not present

## 2022-04-30 DIAGNOSIS — M7581 Other shoulder lesions, right shoulder: Secondary | ICD-10-CM | POA: Diagnosis not present

## 2022-05-15 DIAGNOSIS — M79671 Pain in right foot: Secondary | ICD-10-CM | POA: Diagnosis not present

## 2022-05-15 DIAGNOSIS — L84 Corns and callosities: Secondary | ICD-10-CM | POA: Diagnosis not present

## 2022-05-15 DIAGNOSIS — M2041 Other hammer toe(s) (acquired), right foot: Secondary | ICD-10-CM | POA: Diagnosis not present

## 2022-05-29 DIAGNOSIS — M8580 Other specified disorders of bone density and structure, unspecified site: Secondary | ICD-10-CM | POA: Diagnosis not present

## 2022-05-29 DIAGNOSIS — Z01419 Encounter for gynecological examination (general) (routine) without abnormal findings: Secondary | ICD-10-CM | POA: Diagnosis not present

## 2022-05-29 DIAGNOSIS — E7800 Pure hypercholesterolemia, unspecified: Secondary | ICD-10-CM | POA: Diagnosis not present

## 2022-06-10 DIAGNOSIS — M109 Gout, unspecified: Secondary | ICD-10-CM | POA: Diagnosis not present

## 2022-06-10 DIAGNOSIS — M2041 Other hammer toe(s) (acquired), right foot: Secondary | ICD-10-CM | POA: Diagnosis not present

## 2022-06-25 DIAGNOSIS — K080 Exfoliation of teeth due to systemic causes: Secondary | ICD-10-CM | POA: Diagnosis not present

## 2022-07-04 DIAGNOSIS — G8918 Other acute postprocedural pain: Secondary | ICD-10-CM | POA: Diagnosis not present

## 2022-07-04 DIAGNOSIS — M2041 Other hammer toe(s) (acquired), right foot: Secondary | ICD-10-CM | POA: Diagnosis not present

## 2022-07-10 DIAGNOSIS — M2041 Other hammer toe(s) (acquired), right foot: Secondary | ICD-10-CM | POA: Diagnosis not present

## 2022-08-07 DIAGNOSIS — M2041 Other hammer toe(s) (acquired), right foot: Secondary | ICD-10-CM | POA: Diagnosis not present

## 2022-08-07 DIAGNOSIS — M79671 Pain in right foot: Secondary | ICD-10-CM | POA: Diagnosis not present

## 2022-08-07 DIAGNOSIS — L84 Corns and callosities: Secondary | ICD-10-CM | POA: Diagnosis not present

## 2022-08-19 DIAGNOSIS — R7303 Prediabetes: Secondary | ICD-10-CM | POA: Diagnosis not present

## 2022-08-19 DIAGNOSIS — R7309 Other abnormal glucose: Secondary | ICD-10-CM | POA: Diagnosis not present

## 2022-08-19 DIAGNOSIS — Z0000 Encounter for general adult medical examination without abnormal findings: Secondary | ICD-10-CM | POA: Diagnosis not present

## 2022-08-19 DIAGNOSIS — M8588 Other specified disorders of bone density and structure, other site: Secondary | ICD-10-CM | POA: Diagnosis not present

## 2022-08-19 DIAGNOSIS — E782 Mixed hyperlipidemia: Secondary | ICD-10-CM | POA: Diagnosis not present

## 2022-08-20 ENCOUNTER — Other Ambulatory Visit: Payer: Self-pay | Admitting: Internal Medicine

## 2022-08-20 DIAGNOSIS — M8588 Other specified disorders of bone density and structure, other site: Secondary | ICD-10-CM

## 2022-08-28 DIAGNOSIS — L84 Corns and callosities: Secondary | ICD-10-CM | POA: Diagnosis not present

## 2022-08-28 DIAGNOSIS — M2041 Other hammer toe(s) (acquired), right foot: Secondary | ICD-10-CM | POA: Diagnosis not present

## 2022-08-28 DIAGNOSIS — M79671 Pain in right foot: Secondary | ICD-10-CM | POA: Diagnosis not present

## 2022-09-03 DIAGNOSIS — M8589 Other specified disorders of bone density and structure, multiple sites: Secondary | ICD-10-CM | POA: Diagnosis not present

## 2022-09-03 DIAGNOSIS — Z780 Asymptomatic menopausal state: Secondary | ICD-10-CM | POA: Diagnosis not present

## 2022-09-03 DIAGNOSIS — Z1231 Encounter for screening mammogram for malignant neoplasm of breast: Secondary | ICD-10-CM | POA: Diagnosis not present

## 2022-09-09 DIAGNOSIS — M2041 Other hammer toe(s) (acquired), right foot: Secondary | ICD-10-CM | POA: Diagnosis not present

## 2022-09-09 DIAGNOSIS — M10071 Idiopathic gout, right ankle and foot: Secondary | ICD-10-CM | POA: Diagnosis not present

## 2022-09-09 DIAGNOSIS — L84 Corns and callosities: Secondary | ICD-10-CM | POA: Diagnosis not present

## 2022-09-09 DIAGNOSIS — M79671 Pain in right foot: Secondary | ICD-10-CM | POA: Diagnosis not present

## 2022-10-29 DIAGNOSIS — K080 Exfoliation of teeth due to systemic causes: Secondary | ICD-10-CM | POA: Diagnosis not present

## 2023-01-21 DIAGNOSIS — R079 Chest pain, unspecified: Secondary | ICD-10-CM | POA: Diagnosis not present

## 2023-03-20 DIAGNOSIS — R739 Hyperglycemia, unspecified: Secondary | ICD-10-CM | POA: Diagnosis not present

## 2023-03-20 DIAGNOSIS — R399 Unspecified symptoms and signs involving the genitourinary system: Secondary | ICD-10-CM | POA: Diagnosis not present

## 2023-03-20 DIAGNOSIS — R1030 Lower abdominal pain, unspecified: Secondary | ICD-10-CM | POA: Diagnosis not present

## 2023-03-31 DIAGNOSIS — Z961 Presence of intraocular lens: Secondary | ICD-10-CM | POA: Diagnosis not present

## 2023-03-31 DIAGNOSIS — H16223 Keratoconjunctivitis sicca, not specified as Sjogren's, bilateral: Secondary | ICD-10-CM | POA: Diagnosis not present

## 2023-05-19 DIAGNOSIS — K080 Exfoliation of teeth due to systemic causes: Secondary | ICD-10-CM | POA: Diagnosis not present

## 2023-06-03 DIAGNOSIS — K080 Exfoliation of teeth due to systemic causes: Secondary | ICD-10-CM | POA: Diagnosis not present

## 2023-08-25 DIAGNOSIS — Z0000 Encounter for general adult medical examination without abnormal findings: Secondary | ICD-10-CM | POA: Diagnosis not present

## 2023-08-25 DIAGNOSIS — D649 Anemia, unspecified: Secondary | ICD-10-CM | POA: Diagnosis not present

## 2023-08-25 DIAGNOSIS — M8580 Other specified disorders of bone density and structure, unspecified site: Secondary | ICD-10-CM | POA: Diagnosis not present

## 2023-08-25 DIAGNOSIS — R7303 Prediabetes: Secondary | ICD-10-CM | POA: Diagnosis not present

## 2023-08-25 DIAGNOSIS — E782 Mixed hyperlipidemia: Secondary | ICD-10-CM | POA: Diagnosis not present

## 2023-08-25 DIAGNOSIS — Z23 Encounter for immunization: Secondary | ICD-10-CM | POA: Diagnosis not present

## 2023-09-09 DIAGNOSIS — Z1231 Encounter for screening mammogram for malignant neoplasm of breast: Secondary | ICD-10-CM | POA: Diagnosis not present

## 2023-09-23 DIAGNOSIS — K080 Exfoliation of teeth due to systemic causes: Secondary | ICD-10-CM | POA: Diagnosis not present

## 2023-10-11 DIAGNOSIS — E782 Mixed hyperlipidemia: Secondary | ICD-10-CM | POA: Diagnosis not present

## 2023-10-14 DIAGNOSIS — M8580 Other specified disorders of bone density and structure, unspecified site: Secondary | ICD-10-CM | POA: Diagnosis not present

## 2023-10-14 DIAGNOSIS — Z01419 Encounter for gynecological examination (general) (routine) without abnormal findings: Secondary | ICD-10-CM | POA: Diagnosis not present

## 2023-12-11 DIAGNOSIS — E782 Mixed hyperlipidemia: Secondary | ICD-10-CM | POA: Diagnosis not present

## 2024-01-11 DIAGNOSIS — E782 Mixed hyperlipidemia: Secondary | ICD-10-CM | POA: Diagnosis not present

## 2024-02-10 DIAGNOSIS — E782 Mixed hyperlipidemia: Secondary | ICD-10-CM | POA: Diagnosis not present

## 2024-03-08 NOTE — Progress Notes (Unsigned)
 Cardiology Office Note:    Date:  03/09/2024   ID:  Carol  HILLARI Hancock, DOB 1951-01-26, MRN [id number]  PCP:  Ransom Other, MD  Cardiologist:  Lonni LITTIE Nanas, MD  Electrophysiologist:  None   Referring MD: Ransom Other, MD   Chief Complaint  Patient presents with   Chest Pain    History of Present Illness:    Carol  A Hancock is a 73 y.o. female with a hx of hyperlipidemia who presents as an initial evaluation for shortness of breath and fatigue.  She reports intermittent chest pain.  Occurring about twice per month, occurs on left side of chest.  Describes feeling like pressure.  Can wake her up from sleep.  She walks 2 miles every other day.  Has not noted relationship with exertion.  She denies any dyspnea, lower extremity edema.  Does report some lightheadedness with standing up after bending over but denies any syncopal episodes.  She smoked intermittently in her 67s to 79s, up to 0.25 packs/day, quit age 56.  Family history includes sister died of MI at 53, one brother died of MI at 32, and another brother died of MI at 21; another brother has had 3 MIs, first in 47s.  Past Medical History:  Diagnosis Date   High cholesterol     Past Surgical History:  Procedure Laterality Date   BREAST EXCISIONAL BIOPSY Left 1996   benign    Current Medications: Current Meds  Medication Sig   aspirin 81 MG tablet Take 81 mg by mouth daily.   cholecalciferol (VITAMIN D) 1000 UNITS tablet Take 1,000 Units by mouth daily.   metoprolol tartrate (LOPRESSOR) 50 MG tablet Take one tablet by mouth 2 hours prior to CT scan   Multiple Vitamin (MULTI VITAMIN DAILY) TABS Take by mouth.   Omega-3 Fatty Acids (FISH OIL) 1200 MG CAPS Take by mouth.   simvastatin (ZOCOR) 20 MG tablet Take 20 mg by mouth daily.     Allergies:   Statins and Sulfa antibiotics   Social History   Socioeconomic History   Marital status: Legally Separated    Spouse name: Not on file   Number of  children: Not on file   Years of education: Not on file   Highest education level: Not on file  Occupational History   Not on file  Tobacco Use   Smoking status: Former   Smokeless tobacco: Never  Substance and Sexual Activity   Alcohol use: Yes    Comment: occ   Drug use: Never   Sexual activity: Not on file  Other Topics Concern   Not on file  Social History Narrative   Not on file   Social Drivers of Health   Financial Resource Strain: Not on file  Food Insecurity: Not on file  Transportation Needs: Not on file  Physical Activity: Not on file  Stress: Not on file  Social Connections: Not on file     Family History: The patient's family history is not on file.  ROS:   Please see the history of present illness.     All other systems reviewed and are negative.  EKGs/Labs/Other Studies Reviewed:    The following studies were reviewed today:   EKG:   03/09/24: Normal sinus rhythm, nonspecific T wave flattening, rate 67  Recent Labs: No results found for requested labs within last 365 days.  Recent Lipid Panel No results found for: CHOL, TRIG, HDL, CHOLHDL, VLDL, LDLCALC, LDLDIRECT  Physical Exam:    VS:  BP 122/70 (BP Location: Left Arm, Patient Position: Sitting, Cuff Size: Normal)   Pulse 67   Ht 5' 2 (1.575 m)   Wt 128 lb (58.1 kg)   SpO2 96%   BMI 23.41 kg/m     Wt Readings from Last 3 Encounters:  03/09/24 128 lb (58.1 kg)  12/09/18 121 lb 11.2 oz (55.2 kg)  12/21/15 131 lb (59.4 kg)     GEN:  Well nourished, well developed in no acute distress HEENT: Normal NECK: No JVD; No carotid bruits LYMPHATICS: No lymphadenopathy CARDIAC: RRR, no murmurs, rubs, gallops RESPIRATORY:  Clear to auscultation without rales, wheezing or rhonchi  ABDOMEN: Soft, non-tender, non-distended MUSCULOSKELETAL:  No edema; No deformity  SKIN: Warm and dry NEUROLOGIC:  Alert and oriented x 3 PSYCHIATRIC:  Normal affect   ASSESSMENT:    1.  Precordial pain   2. Encounter to establish care   3. Hyperlipidemia, unspecified hyperlipidemia type    PLAN:    Chest pain: Atypical in description but does have risk factors for CAD with age, hyperlipidemia, and very significant family history.  Recommend coronary CTA to evaluate for obstructive CAD.  Will give Lopressor 50 mg prior to study.  Recommend echocardiogram to rule out structural heart disease  Hyperlipidemia: On simvastatin 20 mg daily.  LDL 115 on 08/25/2023.  Follow-up results of coronary CTA to guide how aggressive to be in lowering cholesterol  RTC in 4 months  Medication Adjustments/Labs and Tests Ordered: Current medicines are reviewed at length with the patient today.  Concerns regarding medicines are outlined above.  Orders Placed This Encounter  Procedures   CT CORONARY MORPH W/CTA COR W/SCORE W/CA W/CM &/OR WO/CM   Basic Metabolic Panel (BMET)   EKG 12-Lead   ECHOCARDIOGRAM COMPLETE   Meds ordered this encounter  Medications   metoprolol tartrate (LOPRESSOR) 50 MG tablet    Sig: Take one tablet by mouth 2 hours prior to CT scan    Dispense:  1 tablet    Refill:  0    Patient Instructions  Medication Instructions:  Your physician recommends that you continue on your current medications as directed. Please refer to the Current Medication list given to you today.  *If you need a refill on your cardiac medications before your next appointment, please call your pharmacy*  Lab Work: Have lab work (BMP) drawn today in the lab on the first floor If you have labs (blood work) drawn today and your tests are completely normal, you will receive your results only by: MyChart Message (if you have MyChart) OR A paper copy in the mail If you have any lab test that is abnormal or we need to change your treatment, we will call you to review the results.  Testing/Procedures: Your physician has requested that you have an echocardiogram. Echocardiography is a painless  test that uses sound waves to create images of your heart. It provides your doctor with information about the size and shape of your heart and how well your heart's chambers and valves are working. This procedure takes approximately one hour. There are no restrictions for this procedure. Please do NOT wear cologne, perfume, aftershave, or lotions (deodorant is allowed). Please arrive 15 minutes prior to your appointment time.  Please note: We ask at that you not bring children with you during ultrasound (echo/ vascular) testing. Due to room size and safety concerns, children are not allowed in the ultrasound rooms during exams. Our front office staff cannot provide observation of children  in our lobby area while testing is being conducted. An adult accompanying a patient to their appointment will only be allowed in the ultrasound room at the discretion of the ultrasound technician under special circumstances. We apologize for any inconvenience.  Your physician has requested that you have cardiac CT. Cardiac computed tomography (CT) is a painless test that uses an x-ray machine to take clear, detailed pictures of your heart. For further information please visit [email or web address] Please follow instruction sheet as given.    Follow-Up: At [facility], you and your health needs are our priority.  As part of our continuing mission to provide you with exceptional heart care, our providers are all part of one team.  This team includes your primary Cardiologist (physician) and Advanced Practice Providers or APPs (Physician Assistants and Nurse Practitioners) who all work together to provide you with the care you need, when you need it.  Your next appointment:   4 month(s)  Provider:   Lonni LITTIE Nanas, MD    We recommend signing up for the patient portal called MyChart.  Sign up information is provided on this After Visit Summary.  MyChart is used to connect with patients for Virtual  Visits (Telemedicine).  Patients are able to view lab/test results, encounter notes, upcoming appointments, etc.  Non-urgent messages can be sent to your provider as well.   To learn more about what you can do with MyChart, go to [email or web address].   Other Instructions     Your cardiac CT will be scheduled at one of the below locations:   [facility] [address] [place], [place] [address] [phone] (Severe contrast allergies only)  OR   [facility] [address] [place], [place] [address] [phone]  OR   [facility] [address] [place], [place] [address] [phone]  OR   [facility] and Vascular Tower [address]  [place], [place] [address]  OR   [facility] [address] [place], [place] [phone]  If scheduled at [facility], please arrive at the [facility] and Children's Entrance (Entrance C2) of [facility] 30 minutes prior to test start time. You can use the FREE valet parking offered at entrance C (encouraged to control the heart rate for the test)  Proceed to the [facility] Radiology Department (first floor) to check-in and test prep.  All radiology patients and guests should use entrance C2 at [facility], accessed from [facility], even though the hospital's physical address listed is [address].  If scheduled at the Heart and Vascular Tower at [address], please enter the parking lot using the [address] entrance and use the FREE valet service at the patient drop-off area. Enter the building and check-in with registration on the main floor.  If scheduled at [facility], please arrive to the Heart and Vascular Center 15 mins early for check-in and test prep.  There is spacious parking and easy access to the radiology department from the [facility] Heart and Vascular entrance. Please enter here  and check-in with the desk attendant.   If scheduled at [facility], please arrive 30 minutes early for check-in and test prep.  Please follow these instructions carefully (unless otherwise directed):  An IV will be required for this test and Nitroglycerin will be given.  Hold all erectile dysfunction medications at least 3 days (72 hrs) prior to test. (Ie viagra,  cialis, sildenafil, tadalafil, etc)   On the Night Before the Test: Be sure to Drink plenty of water. Do not consume any caffeinated/decaffeinated beverages or chocolate 12 hours prior to your test. Do not take any antihistamines 12 hours prior to your test.   On the Day of the Test: Drink plenty of water until 1 hour prior to the test. Do not eat any food 1 hour prior to test. You may take your regular medications prior to the test.  Take metoprolol (Lopressor) two hours prior to test. If you take Furosemide/Hydrochlorothiazide/Spironolactone/Chlorthalidone, please HOLD on the morning of the test. Patients who wear a continuous glucose monitor MUST remove the device prior to scanning. FEMALES- please wear underwire-free bra if available, avoid dresses & tight clothing        After the Test: Drink plenty of water. After receiving IV contrast, you may experience a mild flushed feeling. This is normal. On occasion, you may experience a mild rash up to 24 hours after the test. This is not dangerous. If this occurs, you can take Benadryl 25 mg, Zyrtec, Claritin, or Allegra and increase your fluid intake. (Patients taking Tikosyn should avoid Benadryl, and may take Zyrtec, Claritin, or Allegra) If you experience trouble breathing, this can be serious. If it is severe call 911 IMMEDIATELY. If it is mild, please call our office.  We will call to schedule your test 2-4 weeks out understanding that some insurance companies will need an authorization prior to the service being performed.   For more information and  frequently asked questions, please visit our website : [email or web address]  For non-scheduling related questions, please contact the cardiac imaging nurse navigator should you have any questions/concerns: Cardiac Imaging Nurse Navigators Direct Office Dial: [phone]   For scheduling needs, including cancellations and rescheduling, please call Brittany, [phone].    Signed, Lonni LITTIE Nanas, MD  03/09/2024 4:59 PM    [facility]

## 2024-03-09 ENCOUNTER — Encounter: Payer: Self-pay | Admitting: Cardiology

## 2024-03-09 ENCOUNTER — Ambulatory Visit: Attending: Cardiology | Admitting: Cardiology

## 2024-03-09 VITALS — BP 122/70 | HR 67 | Ht 62.0 in | Wt 128.0 lb

## 2024-03-09 DIAGNOSIS — E785 Hyperlipidemia, unspecified: Secondary | ICD-10-CM

## 2024-03-09 DIAGNOSIS — Z7689 Persons encountering health services in other specified circumstances: Secondary | ICD-10-CM

## 2024-03-09 DIAGNOSIS — R072 Precordial pain: Secondary | ICD-10-CM | POA: Diagnosis not present

## 2024-03-09 MED ORDER — METOPROLOL TARTRATE 50 MG PO TABS: ORAL_TABLET | ORAL | 0 refills | Status: AC

## 2024-03-09 NOTE — Patient Instructions (Addendum)
 Medication Instructions:  Your physician recommends that you continue on your current medications as directed. Please refer to the Current Medication list given to you today.  *If you need a refill on your cardiac medications before your next appointment, please call your pharmacy*  Lab Work: Have lab work (BMP) drawn today in the lab on the first floor If you have labs (blood work) drawn today and your tests are completely normal, you will receive your results only by: MyChart Message (if you have MyChart) OR A paper copy in the mail If you have any lab test that is abnormal or we need to change your treatment, we will call you to review the results.  Testing/Procedures: Your physician has requested that you have an echocardiogram. Echocardiography is a painless test that uses sound waves to create images of your heart. It provides your doctor with information about the size and shape of your heart and how well your heart's chambers and valves are working. This procedure takes approximately one hour. There are no restrictions for this procedure. Please do NOT wear cologne, perfume, aftershave, or lotions (deodorant is allowed). Please arrive 15 minutes prior to your appointment time.  Please note: We ask at that you not bring children with you during ultrasound (echo/ vascular) testing. Due to room size and safety concerns, children are not allowed in the ultrasound rooms during exams. Our front office staff cannot provide observation of children in our lobby area while testing is being conducted. An adult accompanying a patient to their appointment will only be allowed in the ultrasound room at the discretion of the ultrasound technician under special circumstances. We apologize for any inconvenience.  Your physician has requested that you have cardiac CT. Cardiac computed tomography (CT) is a painless test that uses an x-ray machine to take clear, detailed pictures of your heart. For further  information please visit [email or web address] Please follow instruction sheet as given.    Follow-Up: At [facility], you and your health needs are our priority.  As part of our continuing mission to provide you with exceptional heart care, our providers are all part of one team.  This team includes your primary Cardiologist (physician) and Advanced Practice Providers or APPs (Physician Assistants and Nurse Practitioners) who all work together to provide you with the care you need, when you need it.  Your next appointment:   4 month(s)  Provider:   Lonni LITTIE Nanas, MD    We recommend signing up for the patient portal called MyChart.  Sign up information is provided on this After Visit Summary.  MyChart is used to connect with patients for Virtual Visits (Telemedicine).  Patients are able to view lab/test results, encounter notes, upcoming appointments, etc.  Non-urgent messages can be sent to your provider as well.   To learn more about what you can do with MyChart, go to [email or web address].   Other Instructions     Your cardiac CT will be scheduled at one of the below locations:   [facility] [address] [place], [place] [address] [phone] (Severe contrast allergies only)  OR   [facility] [address] [place], [place] [address] [phone]  OR   [facility] [address] [place], [place] [address] [phone]  OR   [facility] [address]  [place], [place] [address]  OR   [facility] [address] [place], [place] [phone]  If scheduled  at [facility], please arrive at the [facility] and Children's Entrance (Entrance C2) of [facility] 30 minutes prior to test start time. You can use the FREE valet parking offered at entrance C (encouraged to control the heart rate for the test)  Proceed to the [facility] Radiology  Department (first floor) to check-in and test prep.  All radiology patients and guests should use entrance C2 at [facility], accessed from [facility], even though the hospital's physical address listed is [address]  If scheduled at the Heart and Vascular Tower at [address], please enter the parking lot using the [address] entrance and use the FREE valet service at the patient drop-off area. Enter the building and check-in with registration on the main floor.  If scheduled at [facility], please arrive to the Heart and Vascular Center 15 mins early for check-in and test prep.  There is spacious parking and easy access to the radiology department from the [facility] Heart and Vascular entrance. Please enter here and check-in with the desk attendant.   If scheduled at [facility], please arrive 30 minutes early for check-in and test prep.  Please follow these instructions carefully (unless otherwise directed):  An IV will be required for this test and Nitroglycerin will be given.  Hold all erectile dysfunction medications at least 3 days (72 hrs) prior to test. (Ie viagra, cialis, sildenafil, tadalafil, etc)   On the Night Before the Test: Be sure to Drink plenty of water. Do not consume any caffeinated/decaffeinated beverages or chocolate 12 hours prior to your test. Do not take any antihistamines 12 hours prior to your test.   On the Day of the Test: Drink plenty of water until 1 hour prior to the test. Do not eat any food 1 hour prior to test. You may take your regular medications prior to the test.  Take metoprolol (Lopressor) two hours prior to test. If you take Furosemide/Hydrochlorothiazide/Spironolactone/Chlorthalidone, please HOLD on the morning of the test. Patients who wear a continuous glucose monitor MUST remove the device prior to scanning. FEMALES- please wear underwire-free bra if available, avoid  dresses & tight clothing        After the Test: Drink plenty of water. After receiving IV contrast, you may experience a mild flushed feeling. This is normal. On occasion, you may experience a mild rash up to 24 hours after the test. This is not dangerous. If this occurs, you can take Benadryl 25 mg, Zyrtec, Claritin, or Allegra and increase your fluid intake. (Patients taking Tikosyn should avoid Benadryl, and may take Zyrtec, Claritin, or Allegra) If you experience trouble breathing, this can be serious. If it is severe call 911 IMMEDIATELY. If it is mild, please call our office.  We will call to schedule your test 2-4 weeks out understanding that some insurance companies will need an authorization prior to the service being performed.   For more information and frequently asked questions, please visit our website : [email or web address]  For non-scheduling related questions, please contact the cardiac imaging nurse navigator should you have any questions/concerns: Cardiac Imaging Nurse Navigators Direct Office Dial: [phone]   For scheduling needs, including cancellations and rescheduling, please call Brittany, [phone].

## 2024-03-10 ENCOUNTER — Ambulatory Visit: Payer: Self-pay | Admitting: Cardiology

## 2024-03-10 DIAGNOSIS — E785 Hyperlipidemia, unspecified: Secondary | ICD-10-CM

## 2024-03-10 LAB — BASIC METABOLIC PANEL WITH GFR
BUN/Creatinine Ratio: 17 (ref 12–28)
BUN: 14 mg/dL (ref 8–27)
CO2: 23 mmol/L (ref 20–29)
Calcium: 9.6 mg/dL (ref 8.7–10.3)
Chloride: 103 mmol/L (ref 96–106)
Creatinine, Ser: 0.83 mg/dL (ref 0.57–1.00)
Glucose: 95 mg/dL (ref 70–99)
Potassium: 3.8 mmol/L (ref 3.5–5.2)
Sodium: 140 mmol/L (ref 134–144)
eGFR: 74 mL/min/1.73 (ref >59)

## 2024-03-12 DIAGNOSIS — E782 Mixed hyperlipidemia: Secondary | ICD-10-CM | POA: Diagnosis not present

## 2024-03-24 ENCOUNTER — Ambulatory Visit (HOSPITAL_COMMUNITY)
Admission: RE | Admit: 2024-03-24 | Discharge: 2024-03-24 | Disposition: A | Discharge status: Home / Self Care | Source: Ambulatory Visit | Attending: Internal Medicine | Admitting: Internal Medicine

## 2024-03-24 DIAGNOSIS — R072 Precordial pain: Secondary | ICD-10-CM | POA: Diagnosis not present

## 2024-03-24 LAB — ECHOCARDIOGRAM COMPLETE
Area-P 1/2: 2.42 cm2
S' Lateral: 2.3 cm

## 2024-03-25 ENCOUNTER — Encounter (HOSPITAL_COMMUNITY): Payer: Self-pay

## 2024-03-30 ENCOUNTER — Ambulatory Visit (HOSPITAL_COMMUNITY)
Admission: RE | Admit: 2024-03-30 | Discharge: 2024-03-30 | Disposition: A | Discharge status: Home / Self Care | Source: Ambulatory Visit | Attending: Cardiology | Admitting: Cardiology

## 2024-03-30 DIAGNOSIS — R072 Precordial pain: Secondary | ICD-10-CM | POA: Diagnosis not present

## 2024-03-30 MED ORDER — NITROGLYCERIN 0.4 MG SL SUBL
0.8000 mg | SUBLINGUAL_TABLET | Freq: Once | SUBLINGUAL | Status: AC
  Administered 2024-03-30: 0.8 mg via SUBLINGUAL

## 2024-03-30 MED ORDER — IOHEXOL 350 MG/ML SOLN
95.0000 mL | Freq: Once | INTRAVENOUS | Status: AC | PRN
  Administered 2024-03-30: 95 mL via INTRAVENOUS

## 2024-04-06 DIAGNOSIS — Z961 Presence of intraocular lens: Secondary | ICD-10-CM | POA: Diagnosis not present

## 2024-04-06 DIAGNOSIS — L239 Allergic contact dermatitis, unspecified cause: Secondary | ICD-10-CM | POA: Diagnosis not present

## 2024-04-06 DIAGNOSIS — H16223 Keratoconjunctivitis sicca, not specified as Sjogren's, bilateral: Secondary | ICD-10-CM | POA: Diagnosis not present

## 2024-04-11 DIAGNOSIS — E782 Mixed hyperlipidemia: Secondary | ICD-10-CM | POA: Diagnosis not present

## 2024-04-12 MED ORDER — ROSUVASTATIN CALCIUM 10 MG PO TABS: 10.0000 mg | ORAL_TABLET | Freq: Every day | ORAL | 3 refills | Status: DC

## 2024-06-16 ENCOUNTER — Ambulatory Visit: Admitting: Cardiology

## 2024-06-16 ENCOUNTER — Encounter: Payer: Self-pay | Admitting: Cardiology

## 2024-06-16 VITALS — BP 128/78 | HR 64 | Ht 62.0 in | Wt 128.0 lb

## 2024-06-16 DIAGNOSIS — E785 Hyperlipidemia, unspecified: Secondary | ICD-10-CM | POA: Diagnosis not present

## 2024-06-16 DIAGNOSIS — I2510 Atherosclerotic heart disease of native coronary artery without angina pectoris: Secondary | ICD-10-CM

## 2024-06-16 LAB — LIPID PANEL
Chol/HDL Ratio: 2.9 ratio (ref 0.0–4.4)
Cholesterol, Total: 164 mg/dL (ref 100–199)
HDL: 56 mg/dL
LDL Chol Calc (NIH): 86 mg/dL (ref 0–99)
Triglycerides: 123 mg/dL (ref 0–149)
VLDL Cholesterol Cal: 22 mg/dL (ref 5–40)

## 2024-06-16 NOTE — Patient Instructions (Signed)
 Medication Instructions:  Your physician recommends that you continue on your current medications as directed. Please refer to the Current Medication list given to you today.  *If you need a refill on your cardiac medications before your next appointment, please call your pharmacy*  Lab Work: Lipid Panel at [facility] on the 1st floor  If you have labs (blood work) drawn today and your tests are completely normal, you will receive your results only by: MyChart Message (if you have MyChart) OR A paper copy in the mail If you have any lab test that is abnormal or we need to change your treatment, we will call you to review the results.  Testing/Procedures: NONE   Follow-Up: At [facility], you and your health needs are our priority.  As part of our continuing mission to provide you with exceptional heart care, our providers are all part of one team.  This team includes your primary Cardiologist (physician) and Advanced Practice Providers or APPs (Physician Assistants and Nurse Practitioners) who all work together to provide you with the care you need, when you need it.  Your next appointment:   6 month(s)  Provider:   Lonni LITTIE Nanas, MD     Other Instructions

## 2024-06-16 NOTE — Progress Notes (Signed)
 " Cardiology Office Note:    Date:  06/16/2024   ID:  Carol Hancock, Carol Hancock 1950/08/22, MRN [id number]  PCP:  Ransom Other, MD  Cardiologist:  Carol LITTIE Nanas, MD  Electrophysiologist:  None   Referring MD: Ransom Other, MD   Chief Complaint  Patient presents with   Chest Pain    History of Present Illness:    Carol  A Hancock is a 74 y.o. female with a hx of hyperlipidemia who presents for follow-up.  She was initially seen on 03/09/2024.  She reported intermittent chest pain.  Coronary CT 11/2023 showed nonobstructive CAD with mild stenosis in the proximal LAD and proximal RCA, minimal stenosis in ramus, calcium  score 48 (62nd percentile).  Echocardiogram 03/2024 showed EF 55 to 60%, normal RV function, no significant valvular disease.  Since last clinic visit, she reports she is doing okay.  States that she developed a rash after received contrast dye for coronary CTA.  She reports her chest pain has improved, denies any recently.  Denies any dyspnea, lightheadedness, syncope, lower extremity edema, or palpitations. Walks at least 4 days per week for 30-60 minutes, denies any exertional symptoms.   Past Medical History:  Diagnosis Date   High cholesterol     Past Surgical History:  Procedure Laterality Date   BREAST EXCISIONAL BIOPSY Left 1996   benign    Current Medications: Current Meds  Medication Sig   aspirin 81 MG tablet Take 81 mg by mouth daily.   cholecalciferol (VITAMIN D) 1000 UNITS tablet Take 1,000 Units by mouth daily.   Multiple Vitamin (MULTI VITAMIN DAILY) TABS Take by mouth.   Omega-3 Fatty Acids (FISH OIL) 1200 MG CAPS Take by mouth.   rosuvastatin  (CRESTOR ) 10 MG tablet Take 1 tablet (10 mg total) by mouth daily.     Allergies:   Statins, Sulfa antibiotics, and Iodinated contrast media   Social History   Socioeconomic History   Marital status: Legally Separated    Spouse name: Not on file   Number of children: Not on file   Years  of education: Not on file   Highest education level: Not on file  Occupational History   Not on file  Tobacco Use   Smoking status: Former   Smokeless tobacco: Never  Substance and Sexual Activity   Alcohol use: Yes    Comment: occ   Drug use: Never   Sexual activity: Not on file  Other Topics Concern   Not on file  Social History Narrative   Not on file   Social Drivers of Health   Tobacco Use: Medium Risk (06/16/2024)   Patient History    Smoking Tobacco Use: Former    Smokeless Tobacco Use: Never    Passive Exposure: Not on Actuary Strain: Not on file  Food Insecurity: Not on file  Transportation Needs: Not on file  Physical Activity: Not on file  Stress: Not on file  Social Connections: Not on file  Depression (EYV7-0): Not on file  Alcohol Screen: Not on file  Housing: Not on file  Utilities: Not on file  Health Literacy: Not on file     Family History: The patient's family history is not on file.  ROS:   Please see the history of present illness.     All other systems reviewed and are negative.  EKGs/Labs/Other Studies Reviewed:    The following studies were reviewed today:   EKG:   03/09/24: Normal sinus rhythm, nonspecific T wave flattening,  rate 67  Recent Labs: 03/09/2024: BUN 14; Creatinine, Ser 0.83; Potassium 3.8; Sodium 140  Recent Lipid Panel No results found for: CHOL, TRIG, HDL, CHOLHDL, VLDL, LDLCALC, LDLDIRECT  Physical Exam:    VS:  BP 128/78 (BP Location: Left Arm, Patient Position: Sitting)   Pulse 64   Ht 5' 2 (1.575 m)   Wt 128 lb (58.1 kg)   SpO2 95%   BMI 23.41 kg/m     Wt Readings from Last 3 Encounters:  06/16/24 128 lb (58.1 kg)  03/09/24 128 lb (58.1 kg)  12/09/18 121 lb 11.2 oz (55.2 kg)     GEN:  Well nourished, well developed in no acute distress HEENT: Normal NECK: No JVD; No carotid bruits LYMPHATICS: No lymphadenopathy CARDIAC: RRR, no murmurs, rubs, gallops RESPIRATORY:   Clear to auscultation without rales, wheezing or rhonchi  ABDOMEN: Soft, non-tender, non-distended MUSCULOSKELETAL:  No edema; No deformity  SKIN: Warm and dry NEUROLOGIC:  Alert and oriented x 3 PSYCHIATRIC:  Normal affect   ASSESSMENT:    1. Coronary artery disease involving native coronary artery of native heart without angina pectoris   2. Hyperlipidemia, unspecified hyperlipidemia type     PLAN:    CAD: Reported atypical chest pain.  Coronary CT 11/2023 showed nonobstructive CAD with mild stenosis in the proximal LAD and proximal RCA, minimal stenosis in ramus, calcium  score 48 (62nd percentile).  Echocardiogram 03/2024 showed EF 55 to 60%, normal RV function, no significant valvular disease. - Continue rosuvastatin  10 mg daily  Hyperlipidemia: On simvastatin 20 mg daily, LDL 115 on 08/25/2023.  Switched to rosuvastatin  10 mg daily after coronary CTA results as above, check lipid panel  RTC in 6 months  Medication Adjustments/Labs and Tests Ordered: Current medicines are reviewed at length with the patient today.  Concerns regarding medicines are outlined above.  Orders Placed This Encounter  Procedures   Lipid panel   No orders of the defined types were placed in this encounter.   Patient Instructions  Medication Instructions:  Your physician recommends that you continue on your current medications as directed. Please refer to the Current Medication list given to you today.  *If you need a refill on your cardiac medications before your next appointment, please call your pharmacy*  Lab Work: Lipid Panel at [facility] on the 1st floor  If you have labs (blood work) drawn today and your tests are completely normal, you will receive your results only by: MyChart Message (if you have MyChart) OR A paper copy in the mail If you have any lab test that is abnormal or we need to change your treatment, we will call you to review the results.  Testing/Procedures: NONE    Follow-Up: At [facility], you and your health needs are our priority.  As part of our continuing mission to provide you with exceptional heart care, our providers are all part of one team.  This team includes your primary Cardiologist (physician) and Advanced Practice Providers or APPs (Physician Assistants and Nurse Practitioners) who all work together to provide you with the care you need, when you need it.  Your next appointment:   6 month(s)  Provider:   Lonni LITTIE Nanas, MD     Other Instructions             Signed, Carol LITTIE Nanas, MD  06/16/2024 8:36 AM    [facility] "

## 2024-06-17 ENCOUNTER — Ambulatory Visit: Payer: Self-pay | Admitting: Cardiology

## 2024-06-17 DIAGNOSIS — Z79899 Other long term (current) drug therapy: Secondary | ICD-10-CM

## 2024-06-17 DIAGNOSIS — E785 Hyperlipidemia, unspecified: Secondary | ICD-10-CM

## 2024-06-17 MED ORDER — ROSUVASTATIN CALCIUM 20 MG PO TABS: 20.0000 mg | ORAL_TABLET | Freq: Every day | ORAL | 3 refills | Status: AC
